# Patient Record
Sex: Female | Born: 1965 | Race: White | Hispanic: No | Smoking: Former smoker
Health system: Southern US, Community
[De-identification: ages and names within clinical notes are randomized; demographics above are authoritative.]

## PROBLEM LIST (undated history)

## (undated) DIAGNOSIS — R519 Headache, unspecified: Secondary | ICD-10-CM

## (undated) DIAGNOSIS — R51 Headache: Secondary | ICD-10-CM

## (undated) DIAGNOSIS — I82409 Acute embolism and thrombosis of unspecified deep veins of unspecified lower extremity: Secondary | ICD-10-CM

## (undated) DIAGNOSIS — E079 Disorder of thyroid, unspecified: Secondary | ICD-10-CM

## (undated) DIAGNOSIS — E119 Type 2 diabetes mellitus without complications: Secondary | ICD-10-CM

## (undated) DIAGNOSIS — R7303 Prediabetes: Secondary | ICD-10-CM

## (undated) DIAGNOSIS — F209 Schizophrenia, unspecified: Secondary | ICD-10-CM

## (undated) DIAGNOSIS — M797 Fibromyalgia: Secondary | ICD-10-CM

## (undated) HISTORY — PX: SALPINGECTOMY: SHX328

## (undated) HISTORY — PX: BREAST CYST ASPIRATION: SHX578

---

## 2009-04-21 ENCOUNTER — Ambulatory Visit: Payer: Self-pay | Admitting: Family Medicine

## 2010-09-26 ENCOUNTER — Ambulatory Visit: Payer: Self-pay | Admitting: Family Medicine

## 2011-04-09 ENCOUNTER — Emergency Department: Payer: Self-pay | Admitting: Emergency Medicine

## 2011-06-18 ENCOUNTER — Emergency Department: Payer: Self-pay | Admitting: Emergency Medicine

## 2012-01-23 ENCOUNTER — Ambulatory Visit: Payer: Self-pay | Admitting: Family Medicine

## 2012-01-30 ENCOUNTER — Ambulatory Visit: Payer: Self-pay | Admitting: Family Medicine

## 2012-03-19 ENCOUNTER — Ambulatory Visit: Payer: Self-pay | Admitting: Internal Medicine

## 2012-08-21 DIAGNOSIS — I82409 Acute embolism and thrombosis of unspecified deep veins of unspecified lower extremity: Secondary | ICD-10-CM | POA: Insufficient documentation

## 2013-01-14 ENCOUNTER — Ambulatory Visit: Payer: Self-pay

## 2013-04-01 DIAGNOSIS — R351 Nocturia: Secondary | ICD-10-CM | POA: Insufficient documentation

## 2013-04-01 DIAGNOSIS — R350 Frequency of micturition: Secondary | ICD-10-CM | POA: Insufficient documentation

## 2013-04-29 ENCOUNTER — Ambulatory Visit: Payer: Self-pay | Admitting: Family Medicine

## 2013-04-30 ENCOUNTER — Ambulatory Visit: Payer: Self-pay | Admitting: Family Medicine

## 2013-05-24 ENCOUNTER — Ambulatory Visit: Payer: Self-pay | Admitting: Internal Medicine

## 2014-06-04 ENCOUNTER — Ambulatory Visit: Payer: Self-pay | Admitting: Family Medicine

## 2014-10-18 ENCOUNTER — Ambulatory Visit: Payer: Self-pay | Admitting: Physician Assistant

## 2014-10-18 LAB — URINALYSIS, COMPLETE
BILIRUBIN, UR: NEGATIVE
BLOOD: NEGATIVE
Bacteria: NEGATIVE
GLUCOSE, UR: NEGATIVE
Ketone: NEGATIVE
Leukocyte Esterase: NEGATIVE
Nitrite: NEGATIVE
Ph: 7 (ref 5.0–8.0)
Protein: NEGATIVE
Specific Gravity: 1.01 (ref 1.000–1.030)

## 2015-02-24 ENCOUNTER — Emergency Department: Payer: Medicaid Other

## 2015-02-24 ENCOUNTER — Emergency Department
Admission: EM | Admit: 2015-02-24 | Discharge: 2015-02-24 | Discharge status: Against Medical Advice | Payer: Medicaid Other | Attending: Emergency Medicine | Admitting: Emergency Medicine

## 2015-02-24 ENCOUNTER — Encounter: Payer: Self-pay | Admitting: *Deleted

## 2015-02-24 ENCOUNTER — Other Ambulatory Visit: Payer: Self-pay

## 2015-02-24 DIAGNOSIS — R079 Chest pain, unspecified: Secondary | ICD-10-CM | POA: Insufficient documentation

## 2015-02-24 HISTORY — DX: Fibromyalgia: M79.7

## 2015-02-24 HISTORY — DX: Disorder of thyroid, unspecified: E07.9

## 2015-02-24 HISTORY — DX: Schizophrenia, unspecified: F20.9

## 2015-02-24 LAB — CBC
HCT: 40.1 % (ref 35.0–47.0)
Hemoglobin: 13.6 g/dL (ref 12.0–16.0)
MCH: 32.1 pg (ref 26.0–34.0)
MCHC: 34 g/dL (ref 32.0–36.0)
MCV: 94.4 fL (ref 80.0–100.0)
PLATELETS: 223 10*3/uL (ref 150–440)
RBC: 4.25 MIL/uL (ref 3.80–5.20)
RDW: 13.5 % (ref 11.5–14.5)
WBC: 10.7 10*3/uL (ref 3.6–11.0)

## 2015-02-24 NOTE — ED Notes (Addendum)
Pt reports pain with inspiration over the left chest area. Today, pt says that she feels like she cannot get a deep breath. Pt also states she has been having intermittent dizziness

## 2015-02-24 NOTE — ED Notes (Signed)
Called lab to find out results for METB and Troponin that was drawn at 1800 that wasn't resulted yet.

## 2015-02-24 NOTE — ED Notes (Signed)
Patient relations has been looking for her son have not found him. Pt wants to look in car escorted by security.

## 2015-02-25 LAB — BASIC METABOLIC PANEL
ANION GAP: 12 (ref 5–15)
BUN: 15 mg/dL (ref 6–20)
CO2: 22 mmol/L (ref 22–32)
Calcium: 9.1 mg/dL (ref 8.9–10.3)
Chloride: 106 mmol/L (ref 101–111)
Creatinine, Ser: 0.7 mg/dL (ref 0.44–1.00)
GFR calc non Af Amer: >60 mL/min (ref >60)
GLUCOSE: 93 mg/dL (ref 65–99)
Potassium: 4 mmol/L (ref 3.5–5.1)
Sodium: 140 mmol/L (ref 135–145)

## 2015-02-25 LAB — TROPONIN I: Troponin I: <0.03 ng/mL (ref <0.031)

## 2015-08-14 ENCOUNTER — Encounter: Payer: Self-pay | Admitting: Gynecology

## 2015-08-14 ENCOUNTER — Ambulatory Visit
Admission: EM | Admit: 2015-08-14 | Discharge: 2015-08-14 | Disposition: A | Discharge status: Home / Self Care | Payer: Medicaid Other | Attending: Family Medicine | Admitting: Family Medicine

## 2015-08-14 DIAGNOSIS — S0551XA Penetrating wound with foreign body of right eyeball, initial encounter: Secondary | ICD-10-CM

## 2015-08-14 HISTORY — DX: Acute embolism and thrombosis of unspecified deep veins of unspecified lower extremity: I82.409

## 2015-08-14 HISTORY — DX: Headache: R51

## 2015-08-14 HISTORY — DX: Headache, unspecified: R51.9

## 2015-08-14 HISTORY — DX: Prediabetes: R73.03

## 2015-08-14 MED ORDER — TETRACAINE HCL 0.5 % OP SOLN
2.0000 [drp] | Freq: Once | OPHTHALMIC | Status: AC
  Administered 2015-08-14: 2 [drp] via OPHTHALMIC

## 2015-08-14 MED ORDER — FLUORESCEIN SODIUM 1 MG OP STRP
1.0000 | ORAL_STRIP | Freq: Once | OPHTHALMIC | Status: AC
  Administered 2015-08-14: 1 via OPHTHALMIC

## 2015-08-14 MED ORDER — ERYTHROMYCIN 5 MG/GM OP OINT: TOPICAL_OINTMENT | OPHTHALMIC | Status: DC

## 2015-08-14 MED ORDER — HYDROCODONE-ACETAMINOPHEN 5-325 MG PO TABS: 1.0000 | ORAL_TABLET | Freq: Three times a day (TID) | ORAL | Status: DC | PRN

## 2015-08-14 NOTE — ED Notes (Signed)
Patient c/o right eye scratching at her pupil. Patient stated something in  Right eye

## 2015-08-14 NOTE — ED Provider Notes (Signed)
CSN: [id number]     Arrival date & time 08/14/15  1349 History   First MD Initiated Contact with Patient 08/14/15 1555   Nurses notes were reviewed.  Chief Complaint  Patient presents with  . Foreign Body in Eye    Individual reports having something in her right eye. She woke this morning had a sensation of something was in her right eye. Denies or unable to say what she got into her eye but she states is been irritating. (Consider location/radiation/quality/duration/timing/severity/associated sxs/prior Treatment) Patient is a 49 y.o. female presenting with foreign body in eye.  Foreign Body in Eye This is a new problem. The current episode started 6 to 12 hours ago. The problem occurs constantly. The problem has not changed since onset.Pertinent negatives include no chest pain, no abdominal pain, no headaches and no shortness of breath. Nothing relieves the symptoms. She has tried nothing for the symptoms.    Past Medical History  Diagnosis Date  . Thyroid disease   . Schizophrenia (HCC)   . Fibromyalgia   . Prediabetes   . Head ache   . DVT (deep venous thrombosis) (HCC)    History reviewed. No pertinent past surgical history. History reviewed. No pertinent family history. Social History  Substance Use Topics  . Smoking status: Former Games developer  . Smokeless tobacco: None  . Alcohol Use: Yes   OB History    No data available     Review of Systems  Eyes: Positive for pain, redness and itching. Negative for discharge and visual disturbance.  Respiratory: Negative for shortness of breath.   Cardiovascular: Negative for chest pain.  Gastrointestinal: Negative for abdominal pain.  Neurological: Negative for headaches.  All other systems reviewed and are negative.   Allergies  Review of patient's allergies indicates no known allergies.  Home Medications   Prior to Admission medications   Medication Sig Start Date End Date Taking? Authorizing Provider  acetaminophen  (TYLENOL) 500 MG tablet Take 500 mg by mouth every 6 (six) hours as needed.   Yes Historical Provider, MD  diclofenac (VOLTAREN) 75 MG EC tablet Take 75 mg by mouth 2 (two) times daily.   Yes Historical Provider, MD  gabapentin (NEURONTIN) 100 MG capsule Take 100 mg by mouth 3 (three) times daily.   Yes Historical Provider, MD  levothyroxine (SYNTHROID, LEVOTHROID) 137 MCG tablet Take 137 mcg by mouth daily before breakfast.   Yes Historical Provider, MD  mupirocin ointment (BACTROBAN) 2 % Place 1 application into the nose 2 (two) times daily.   Yes Historical Provider, MD  nystatin (MYCOSTATIN) 100000 UNIT/ML suspension Take 5 mLs by mouth 4 (four) times daily.   Yes Historical Provider, MD  OLANZapine zydis (ZYPREXA) 10 MG disintegrating tablet Take 10 mg by mouth at bedtime.   Yes Historical Provider, MD  erythromycin ophthalmic ointment Place a 1/2 inch ribbon of ointment into the lower eyelid. 08/14/15   Hassan Rowan, MD  HYDROcodone-acetaminophen (NORCO) 5-325 MG tablet Take 1 tablet by mouth every 8 (eight) hours as needed for moderate pain. 08/14/15   Hassan Rowan, MD   Meds Ordered and Administered this Visit   Medications  fluorescein ophthalmic strip 1 strip (1 strip Right Eye Given 08/14/15 1623)  tetracaine (PONTOCAINE) 0.5 % ophthalmic solution 2 drop (2 drops Right Eye Given 08/14/15 1623)    BP 124/79 mmHg  Pulse 70  Temp(Src) 97.6 F (36.4 C) (Oral)  Resp 18  Ht  (1.727 m)  Wt 248 lb (112.492 kg)  BMI 37.72 kg/m2  SpO2 100% No data found.   Physical Exam  Constitutional: She is oriented to person, place, and time. She appears well-developed and well-nourished. She appears distressed.  HENT:  Head: Normocephalic and atraumatic.  Eyes: Conjunctivae are normal. Pupils are equal, round, and reactive to light. Foreign body present in the right eye.  Appears be a very fine hair was found on the cornea of the right eye and his area of irritation consistent with foreign  objects causing abrasion.  Neck: Normal range of motion. Neck supple.  Musculoskeletal: Normal range of motion.  Neurological: She is alert and oriented to person, place, and time.  Skin: Skin is warm and dry. She is not diaphoretic. No erythema.  Psychiatric: She has a normal mood and affect. Her behavior is normal.    ED Course  .Foreign Body Removal Date/Time: 08/14/2015 4:25 PM Performed by: Hassan RowanWADE, Hanz Winterhalter Authorized by: Hassan RowanWADE, Kyarra Vancamp Consent: Verbal consent obtained. Consent given by: patient Body area: eye Location details: right eyelid Local anesthetic: tetracaine drops Patient sedated: no Patient restrained: no Localization method: eyelid eversion Removal mechanism: irrigation and moist cotton swab Eye examined with fluorescein. Corneal abrasion size: small Corneal abrasion location: lateral No residual rust ring present. Dressing: antibiotic ointment Depth: superficial Complexity: simple Post-procedure assessment: foreign body removed Comments: Small string-like material was removed from the eye. The eyelid was then inverted and no foreign objects were recovered found on the eyelid itself. Patient had trouble tolerating the inversion of the eye but otherwise seemed comfortable. Irrigate the eye with at least 250 mL's of saline and then placed on antibiotic eyedrops and pain medication as needed.   (including critical care time)  Labs Review Labs Reviewed - No data to display  Imaging Review No results found.   Visual Acuity Review  Right Eye Distance:   Left Eye Distance:   Bilateral Distance:    Right Eye Near: R Near: 20/50 Left Eye Near:  L Near: 20/25 Bilateral Near:  20/25 (with corrective lens)      MDM   1. Foreign body in eyeball, right, initial encounter    Patient was sent home on erythromycin ointment and Norco for pain medication and management #12. Patient was instructed that if she is not better in 2448 hrs. she will need to go to  emergency room and see ophthalmologist. Strongly discouraged her return here since this nothing further we can do here.   Hassan RowanEugene Srijan Givan, MD 08/14/15 [phone]

## 2015-08-14 NOTE — Discharge Instructions (Signed)
Eye Foreign Body  A foreign body is an object on or in the eye that should not be there. The object could be a speck of dirt or dust, a hair, an eyelash, a splinter, or any other object.  HOME CARE   Take medicines only as told by your doctor. Use eye drops or ointment as told.   If no eye patch was put on:   Keep the eye closed as much as possible.   Do not rub the eye.   Wear dark glasses in bright light.   Do not wear contact lenses until the eye feels normal, or as told by your doctor.   Wear protective eye covering when needed, especially when using high-speed tools.   If your eye is patched:   Follow your doctor's instructions for when to remove the patch.   Do notdrive or use machines while the eye patch is on. Judging distances is hard to do while wearing a patch.   Keep all follow-up visits as told by your doctor. This is important.  GET HELP IF:    Your pain gets worse.   Your vision gets worse.   You have problems with your eye patch.   You have fluid (discharge) coming from your eye.   You have redness and swelling around your eye.  MAKE SURE YOU:    Understand these instructions.   Will watch your condition.   Will get help right away if you are not doing well or get worse.     This information is not intended to replace advice given to you by your health care provider. Make sure you discuss any questions you have with your health care provider.     Document Released: 03/22/2010 Document Revised: 10/23/2014 Document Reviewed: 02/27/2013  Elsevier Interactive Patient Education 2016 [employer]

## 2015-08-25 DIAGNOSIS — E119 Type 2 diabetes mellitus without complications: Secondary | ICD-10-CM | POA: Insufficient documentation

## 2015-08-25 DIAGNOSIS — E669 Obesity, unspecified: Secondary | ICD-10-CM | POA: Insufficient documentation

## 2015-08-25 DIAGNOSIS — R519 Headache, unspecified: Secondary | ICD-10-CM | POA: Insufficient documentation

## 2015-09-12 ENCOUNTER — Encounter: Payer: Self-pay | Admitting: Gynecology

## 2015-09-12 ENCOUNTER — Ambulatory Visit
Admission: EM | Admit: 2015-09-12 | Discharge: 2015-09-12 | Disposition: A | Discharge status: Home / Self Care | Payer: Medicaid Other | Attending: Family Medicine | Admitting: Family Medicine

## 2015-09-12 DIAGNOSIS — M62830 Muscle spasm of back: Secondary | ICD-10-CM

## 2015-09-12 DIAGNOSIS — F209 Schizophrenia, unspecified: Secondary | ICD-10-CM | POA: Diagnosis not present

## 2015-09-12 DIAGNOSIS — E079 Disorder of thyroid, unspecified: Secondary | ICD-10-CM | POA: Diagnosis not present

## 2015-09-12 DIAGNOSIS — Z7982 Long term (current) use of aspirin: Secondary | ICD-10-CM | POA: Insufficient documentation

## 2015-09-12 DIAGNOSIS — M797 Fibromyalgia: Secondary | ICD-10-CM | POA: Diagnosis not present

## 2015-09-12 DIAGNOSIS — R1031 Right lower quadrant pain: Secondary | ICD-10-CM | POA: Diagnosis not present

## 2015-09-12 DIAGNOSIS — Z79899 Other long term (current) drug therapy: Secondary | ICD-10-CM | POA: Diagnosis not present

## 2015-09-12 DIAGNOSIS — M549 Dorsalgia, unspecified: Secondary | ICD-10-CM | POA: Diagnosis present

## 2015-09-12 DIAGNOSIS — Z87891 Personal history of nicotine dependence: Secondary | ICD-10-CM | POA: Diagnosis not present

## 2015-09-12 LAB — URINALYSIS COMPLETE WITH MICROSCOPIC (ARMC ONLY)
Bilirubin Urine: NEGATIVE
Glucose, UA: NEGATIVE mg/dL
HGB URINE DIPSTICK: NEGATIVE
KETONES UR: NEGATIVE mg/dL
Nitrite: NEGATIVE
PH: 7 (ref 5.0–8.0)
PROTEIN: NEGATIVE mg/dL
Specific Gravity, Urine: 1.015 (ref 1.005–1.030)

## 2015-09-12 LAB — PREGNANCY, URINE: Preg Test, Ur: NEGATIVE

## 2015-09-12 MED ORDER — TIZANIDINE HCL 4 MG PO CAPS: 4.0000 mg | ORAL_CAPSULE | Freq: Three times a day (TID) | ORAL | Status: DC | PRN

## 2015-09-12 NOTE — ED Provider Notes (Signed)
CSN: [id number]     Arrival date & time 09/12/15  1355 History   First MD Initiated Contact with Patient 09/12/15 1654     Chief Complaint  Patient presents with  . Back Pain   (Consider location/radiation/quality/duration/timing/severity/associated sxs/prior Treatment) HPI  49 yo F presents with her son concerned about back pain. Denies trauma, no fall, no accident. Has been busy cleaning house  Doing holiday cooking. Also remembers that she put up the Christmas tree yesterday. She has had no nausea, vomiting or diarrhea. No dysuria, no frequency Reports that discomfort was "very bad " last night. She is taking Extra strength Tylenol every 4 hours.  States she used to be on Diclofenac But her PCP "took it away so it wouldn't hurt my liver".  States she can't take tylenol or ASA. Her concerns and explanations wander throughout her body; and divert to multiple focuses during course of visit.  Later in the visit as back exam/consult underway patient presented additional concern  She is now complaining of right lower quadrant pain, waxing and waning "for a while" but becoming more severe.  She had a right ectopic many years ago.Right tube reported surgically absent. 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  She reports that she has been told she has increased risk of infections. She has had infections in her bowels. She ate 3 times today, denies nausea or vomiting.Denies fever, but refers back to right lower quadrant pain complaint. States that she was up during the night with pain in this area as well . Had normal bowel movement this morning without pain.No diarrhea No menses x 2 years.   Past Medical History  Diagnosis Date  . Thyroid disease   . Schizophrenia (HCC)   . Fibromyalgia   . Prediabetes   . Head ache   . DVT (deep venous thrombosis) (HCC)    History reviewed. No pertinent past surgical history. No family history on file. Social History   Substance Use Topics  . Smoking status: Former Games developer  . Smokeless tobacco: None  . Alcohol Use: Yes   OB History    No data available     Review of Systems Constitutional: No fever.  Eyes: No visual changes. ENT:No sore throat. Cardiovascular:Negative for chest pain/palpitations Respiratory: Negative for shortness of breath Gastrointestinal: Right lower quadrant abdominal pain. No nausea,vomiting, diarrhea Genitourinary: Negative for dysuria. Normal urination. Musculoskeletal: Generalized  back pain. FROM extremities without pain Skin: Negative for rash Neurological: Negative for headache, focal weakness or numbness  Allergies  Review of patient's allergies indicates no known allergies.  Home Medications   Prior to Admission medications   Medication Sig Start Date End Date Taking? Authorizing Provider  acetaminophen (TYLENOL) 500 MG tablet Take 500 mg by mouth every 6 (six) hours as needed.   Yes Historical Provider, MD  aspirin EC 81 MG tablet Take 81 mg by mouth daily.   Yes Historical Provider, MD  Atorvastatin Calcium (LIPITOR PO) Take by mouth.   Yes Historical Provider, MD  gabapentin (NEURONTIN) 100 MG capsule Take 100 mg by mouth 3 (three) times daily.   Yes Historical Provider, MD  levothyroxine (SYNTHROID, LEVOTHROID) 137 MCG tablet Take 137 mcg by mouth daily before breakfast.   Yes Historical Provider, MD  metFORMIN (GLUCOPHAGE) 500 MG tablet Take by mouth 2 (two) times daily with a meal.   Yes Historical Provider, MD  mupirocin ointment (BACTROBAN) 2 % Place 1 application into the nose 2 (two) times  daily.   Yes Historical Provider, MD  nystatin (MYCOSTATIN) 100000 UNIT/ML suspension Take 5 mLs by mouth 4 (four) times daily.   Yes Historical Provider, MD  OLANZapine zydis (ZYPREXA) 10 MG disintegrating tablet Take 10 mg by mouth at bedtime.   Yes Historical Provider, MD  diclofenac (VOLTAREN) 75 MG EC tablet Take 75 mg by mouth 2 (two) times daily.    Historical  Provider, MD  erythromycin ophthalmic ointment Place a 1/2 inch ribbon of ointment into the lower eyelid. 08/14/15   Hassan RowanEugene Wade, MD  HYDROcodone-acetaminophen (NORCO) 5-325 MG tablet Take 1 tablet by mouth every 8 (eight) hours as needed for moderate pain. 08/14/15   Hassan RowanEugene Wade, MD  tiZANidine (ZANAFLEX) 4 MG capsule Take 1 capsule (4 mg total) by mouth 3 (three) times daily as needed for muscle spasms. 09/12/15   Rae HalstedLaurie W Lee, PA-C   Meds Ordered and Administered this Visit  Medications - No data to display  BP 120/73 mmHg  Pulse 70  Temp(Src) 96.9 F (36.1 C) (Tympanic)  Resp 18  Ht 5\' 8"  (1.727 m)  Wt 247 lb (112.038 kg)  BMI 37.56 kg/m2  SpO2 98% No data found.   Physical Exam   Constitutional -alert and oriented,well appearing  General: No  acute distress Head-atraumatic, normocephalic Eyes- conjunctiva normal, EOMI ,conjugate gaze Ears: grossly normal hearing Nose- no congestion or rhinorrhea Mouth/throat- mucous membranes moist ,oropharynx non-erythematous Neck- supple without glandular enlargement CV- regular rate and rhythmn Resp-no distress, normal respiratory effort,clear to auscultation bilaterally Back - para spinal muscle tenderness to palpation, right greater than left ; can localize muscle belly tenderness, no CVAT , area of discomfort inconsistent during exam; SLR negative and DTRs and pulses are present and equal bilaterally GI- obese, generally soft, and initial exam was negative in all quadrants , + BS all quadrants . Patient later indicates that the right lower quadrant is painful And returns to indicate that area. My palpation yields no mass or fullness and pain report very inconsistent  GU-  not examined-defered  MSK- non tender, normal ROM, ambulatory, no gait instability,on /off table solo, she drove here, ambulatory in area and self care Neuro- focus wanders and repeats,alert and generally conversational, no gross focal neurological deficit  appreciated,  Skin-warm,dry ,intact;  Psych-mood and affect grossly normal; speech and behavior grossly normal  ED Course  Procedures (including critical care time)  Labs Review Labs Reviewed  URINALYSIS COMPLETEWITH MICROSCOPIC ([facility] ONLY) - Abnormal; Notable for the following:    Color, Urine STRAW (*)    Leukocytes, UA TRACE (*)    Bacteria, UA RARE (*)    Squamous Epithelial / LPF 0-5 (*)    All other components within normal limits  URINE CULTURE  PREGNANCY, URINE    Imaging Review No results found.  Consulted with Dr Thurmond ButtsWade. We do not have CT available at his time and he recommends we defer additional labs and intervention- To  refer patient to ER for evaluation of her abdominal pain. Pelvic exam deferred pending additional studies. She is counseled to call in 3 days for final on her urinalysis - no medication given at this time; Her back muscle spasms were treated with a bedtime does of Zanaflex to help her sleep. Her son is of the home and present throughourt the visit, exam and pain and both express understanding  MDM   1. Muscle spasm of back   2. RLQ abdominal pain    Plan: Test results and diagnosis reviewed with  patient/son We recommend that her right lower quadrant pain be further evaluated and they are counseled to report to ER directly Her urine is being submitted for culture, u/a finding non-specific Back pain felt related to over activity and muscle spasm Medication given, heat pad and gentle stretches encouraged Rx as per orders;  benefits, risks, potential side effects reviewed     Rae Halsted, PA-C 09/12/15 1947

## 2015-09-12 NOTE — ED Notes (Signed)
Patient c/o lower back pain.

## 2015-09-12 NOTE — Discharge Instructions (Signed)
Even though you came to see Andrea [name] because of your back pain, you have also talked about Having pain in the right lower side of your abdomen /belly. You have had surgery on that area in the past, but you still have your appendix by your report. You have both your ovaries and your uterus by your report to me. We are unable at this time to evaluate inside your abdomen. I cannot see inside you to tell  if this might be a problem with your ovary, your appendix,a possible stone from the kidney--or as you mentioned a problem with the intestines as you have had before. As I discussed with you and your son, I need to recommend that you travel to the Emergency Room so that the right lower pain can be evaluated.   Your back exam shows muscle spasm---tight crampy muscles-- This is common when you have been doing a lot of heavy cleaning and moving things- As well as all the holiday -preparations you told me about. A heat pad or ice packs can help make you comfortable as well I have given you a prescription for muscle relaxer to be used at bedtime in the future   Please be sure to go to have your abdominal pain examined tonight !   Abdominal Pain, Adult Many things can cause abdominal pain. Usually, abdominal pain is not caused by a disease and will improve without treatment. It can often be observed and treated at home. Your health care provider will do a physical exam and possibly order blood tests and X-rays to help determine the seriousness of your pain. However, in many cases, more time must pass before a clear cause of the pain can be found. Before that point, your health care provider may not know if you need more testing or further treatment. HOME CARE INSTRUCTIONS Monitor your abdominal pain for any changes. The following actions may help to alleviate any discomfort you are experiencing:  Only take over-the-counter or prescription medicines as directed by your health care provider.  Do not take  laxatives unless directed to do so by your health care provider.  Try a clear liquid diet (broth, tea, or water) as directed by your health care provider. Slowly move to a bland diet as tolerated. SEEK MEDICAL CARE IF:  You have unexplained abdominal pain.  You have abdominal pain associated with nausea or diarrhea.  You have pain when you urinate or have a bowel movement.  You experience abdominal pain that wakes you in the night.  You have abdominal pain that is worsened or improved by eating food.  You have abdominal pain that is worsened with eating fatty foods.  You have a fever. SEEK IMMEDIATE MEDICAL CARE IF:  Your pain does not go away within 2 hours.  You keep throwing up (vomiting).  Your pain is felt only in portions of the abdomen, such as the right side or the left lower portion of the abdomen.  You pass bloody or black tarry stools. MAKE SURE YOU:  Understand these instructions.  Will watch your condition.  Will get help right away if you are not doing well or get worse.   This information is not intended to replace advice given to you by your health care provider. Make sure you discuss any questions you have with your health care provider.   Document Released: 07/12/2005 Document Revised: 06/23/2015 Document Reviewed: 06/11/2013 Elsevier Interactive Patient Education 2016 [place].  Back Exercises If you have pain in your  back, do these exercises 2-3 times each day or as told by your doctor. When the pain goes away, do the exercises once each day, but repeat the steps more times for each exercise (do more repetitions). If you do not have pain in your back, do these exercises once each day or as told by your doctor. EXERCISES Single Knee to Chest Do these steps 3-5 times in a row for each leg:  Lie on your back on a firm bed or the floor with your legs stretched out.  Bring one knee to your chest.  Hold your knee to your chest by grabbing your knee  or thigh.  Pull on your knee until you feel a gentle stretch in your lower back.  Keep doing the stretch for 10-30 seconds.  Slowly let go of your leg and straighten it. Pelvic Tilt Do these steps 5-10 times in a row:  Lie on your back on a firm bed or the floor with your legs stretched out.  Bend your knees so they point up to the ceiling. Your feet should be flat on the floor.  Tighten your lower belly (abdomen) muscles to press your lower back against the floor. This will make your tailbone point up to the ceiling instead of pointing down to your feet or the floor.  Stay in this position for 5-10 seconds while you gently tighten your muscles and breathe evenly. Cat-Cow Do these steps until your lower back bends more easily:  Get on your hands and knees on a firm surface. Keep your hands under your shoulders, and keep your knees under your hips. You may put padding under your knees.  Let your head hang down, and make your tailbone point down to the floor so your lower back is round like the back of a cat.  Stay in this position for 5 seconds.  Slowly lift your head and make your tailbone point up to the ceiling so your back hangs low (sags) like the back of a cow.  Stay in this position for 5 seconds. Press-Ups Do these steps 5-10 times in a row:  Lie on your belly (face-down) on the floor.  Place your hands near your head, about shoulder-width apart.  While you keep your back relaxed and keep your hips on the floor, slowly straighten your arms to raise the top half of your body and lift your shoulders. Do not use your back muscles. To make yourself more comfortable, you may change where you place your hands.  Stay in this position for 5 seconds.  Slowly return to lying flat on the floor. [place] Do these steps 10 times in a row:  Lie on your back on a firm surface.  Bend your knees so they point up to the ceiling. Your feet should be flat on the floor.  Tighten your  butt muscles and lift your butt off of the floor until your waist is almost as high as your knees. If you do not feel the muscles working in your butt and the back of your thighs, slide your feet 1-2 inches farther away from your butt.  Stay in this position for 3-5 seconds.  Slowly lower your butt to the floor, and let your butt muscles relax. If this exercise is too easy, try doing it with your arms crossed over your chest. Belly Crunches Do these steps 5-10 times in a row:  Lie on your back on a firm bed or the floor with your legs stretched  out.  [place] your knees so they point up to the ceiling. Your feet should be flat on the floor.  Cross your arms over your chest.  Tip your chin a little bit toward your chest but do not bend your neck.  Tighten your belly muscles and slowly raise your chest just enough to lift your shoulder blades a tiny bit off of the floor.  Slowly lower your chest and your head to the floor. Back Lifts Do these steps 5-10 times in a row: 1. Lie on your belly (face-down) with your arms at your sides, and rest your forehead on the floor. 2. Tighten the muscles in your legs and your butt. 3. Slowly lift your chest off of the floor while you keep your hips on the floor. Keep the back of your head in line with the curve in your back. Look at the floor while you do this. 4. Stay in this position for 3-5 seconds. 5. Slowly lower your chest and your face to the floor. GET HELP IF:  Your back pain gets a lot worse when you do an exercise.  Your back pain does not lessen 2 hours after you exercise. If you have any of these problems, stop doing the exercises. Do not do them again unless your doctor says it is okay. GET HELP RIGHT AWAY IF:  You have sudden, very bad back pain. If this happens, stop doing the exercises. Do not do them again unless your doctor says it is okay.   This information is not intended to replace advice given to you by your health care  provider. Make sure you discuss any questions you have with your health care provider.   Document Released: 11/04/2010 Document Revised: 06/23/2015 Document Reviewed: 11/26/2014 Elsevier Interactive Patient Education 2016 [place] Injury Prevention Back injuries can be very painful. They can also be difficult to heal. After having one back injury, you are more likely to injure your back again. It is important to learn how to avoid injuring or re-injuring your back. The following tips can help you to prevent a back injury. WHAT SHOULD I KNOW ABOUT PHYSICAL FITNESS?  Exercise for 30 minutes per day on most days of the week or as told by your doctor. Make sure to:  Do aerobic exercises, such as walking, jogging, biking, or swimming.  Do exercises that increase balance and strength, such as tai chi and yoga.  Do stretching exercises. This helps with flexibility.  Try to develop strong belly (abdominal) muscles. Your belly muscles help to support your back.  Stay at a healthy weight. This helps to decrease your risk of a back injury. WHAT SHOULD I KNOW ABOUT MY DIET?  Talk with your doctor about your overall diet. Take supplements and vitamins only as told by your doctor.  Talk with your doctor about how much calcium and vitamin D you need each day. These nutrients help to prevent weakening of the bones (osteoporosis).  Include good sources of calcium in your diet, such as:  Dairy products.  Green leafy vegetables.  Products that have had calcium added to them (fortified).  Include good sources of vitamin D in your diet, such as:  Milk.  Foods that have had vitamin D added to them. WHAT SHOULD I KNOW ABOUT MY POSTURE?  Sit up straight and stand up straight. Avoid leaning forward when you sit or hunching over when you stand.  Choose chairs that have good low-back (lumbar) support.  If you  work at a desk, sit close to it so you do not need to lean over. Keep your  chin tucked in. Keep your neck drawn back. Keep your elbows bent so your arms look like the letter "L" (right angle).  Sit high and close to the steering wheel when you drive. Add a low-back support to your car seat, if needed.  Avoid sitting or standing in one position for very long. Take breaks to get up, stretch, and walk around at least one time every hour. Take breaks every hour if you are driving for long periods of time.  Sleep on your side with your knees slightly bent, or sleep on your back with a pillow under your knees. Do not lie on the front of your body to sleep. WHAT SHOULD I KNOW ABOUT LIFTING, TWISTING, AND REACHING Lifting and Heavy Lifting  Avoid heavy lifting, especially lifting over and over again. If you must do heavy lifting:  Stretch before lifting.  Work slowly.  Rest between lifts.  Use a tool such as a cart or a dolly to move objects if one is available.  Make several small trips instead of carrying one heavy load.  Ask for help when you need it, especially when moving big objects.  Follow these steps when lifting:  Stand with your feet shoulder-width apart.  Get as close to the object as you can. Do not pick up a heavy object that is far from your body.  Use handles or lifting straps if they are available.  Bend at your knees. Squat down, but keep your heels off the floor.  Keep your shoulders back. Keep your chin tucked in. Keep your back straight.  Lift the object slowly while you tighten the muscles in your legs, belly, and butt. Keep the object as close to the center of your body as possible.  Follow these steps when putting down a heavy load:  Stand with your feet shoulder-width apart.  Lower the object slowly while you tighten the muscles in your legs, belly, and butt. Keep the object as close to the center of your body as possible.  Keep your shoulders back. Keep your chin tucked in. Keep your back straight.  Bend at your knees. Squat  down, but keep your heels off the floor.  Use handles or lifting straps if they are available. Twisting and Reaching  Avoid lifting heavy objects above your waist.  Do not twist at your waist while you are lifting or carrying a load. If you need to turn, move your feet.  Do not bend over without bending at your knees.  Avoid reaching over your head, across a table, or for an object on a high surface.  WHAT ARE SOME OTHER TIPS?  Avoid wet floors and icy ground. Keep sidewalks clear of ice to prevent falls.   Do not sleep on a mattress that is too soft or too hard.   Keep items that you use often within easy reach.   Put heavier objects on shelves at waist level, and put lighter objects on lower or higher shelves.  Find ways to lower your stress, such as:  Exercise.  Massage.  Relaxation techniques.  Talk with your doctor if you feel anxious or depressed. These conditions can make back pain worse.  Wear flat heel shoes with cushioned soles.  Avoid making quick (sudden) movements.  Use both shoulder straps when carrying a backpack.  Do not use any tobacco products, including cigarettes, chewing tobacco, or  electronic cigarettes. If you need help quitting, ask your doctor.   This information is not intended to replace advice given to you by your health care provider. Make sure you discuss any questions you have with your health care provider.   Document Released: 03/20/2008 Document Revised: 02/16/2015 Document Reviewed: 10/06/2014 Elsevier Interactive Patient Education [employer].

## 2015-09-14 LAB — URINE CULTURE: SPECIAL REQUESTS: NORMAL

## 2015-11-05 ENCOUNTER — Encounter: Payer: Self-pay | Admitting: Emergency Medicine

## 2015-11-05 ENCOUNTER — Ambulatory Visit
Admission: EM | Admit: 2015-11-05 | Discharge: 2015-11-05 | Disposition: A | Discharge status: Home / Self Care | Payer: Medicaid Other | Attending: Family Medicine | Admitting: Family Medicine

## 2015-11-05 DIAGNOSIS — Z7984 Long term (current) use of oral hypoglycemic drugs: Secondary | ICD-10-CM | POA: Diagnosis not present

## 2015-11-05 DIAGNOSIS — E079 Disorder of thyroid, unspecified: Secondary | ICD-10-CM | POA: Insufficient documentation

## 2015-11-05 DIAGNOSIS — Z7982 Long term (current) use of aspirin: Secondary | ICD-10-CM | POA: Insufficient documentation

## 2015-11-05 DIAGNOSIS — F209 Schizophrenia, unspecified: Secondary | ICD-10-CM | POA: Diagnosis not present

## 2015-11-05 DIAGNOSIS — M797 Fibromyalgia: Secondary | ICD-10-CM | POA: Insufficient documentation

## 2015-11-05 DIAGNOSIS — N760 Acute vaginitis: Secondary | ICD-10-CM | POA: Diagnosis not present

## 2015-11-05 DIAGNOSIS — E119 Type 2 diabetes mellitus without complications: Secondary | ICD-10-CM | POA: Diagnosis not present

## 2015-11-05 DIAGNOSIS — T192XXA Foreign body in vulva and vagina, initial encounter: Secondary | ICD-10-CM | POA: Diagnosis present

## 2015-11-05 HISTORY — DX: Type 2 diabetes mellitus without complications: E11.9

## 2015-11-05 LAB — CHLAMYDIA/NGC RT PCR (ARMC ONLY)
Chlamydia Tr: NOT DETECTED
N gonorrhoeae: NOT DETECTED

## 2015-11-05 LAB — WET PREP, GENITAL
Clue Cells Wet Prep HPF POC: NONE SEEN
Sperm: NONE SEEN
Trich, Wet Prep: NONE SEEN
Yeast Wet Prep HPF POC: NONE SEEN

## 2015-11-05 MED ORDER — METRONIDAZOLE 500 MG PO TABS: 500.0000 mg | ORAL_TABLET | Freq: Two times a day (BID) | ORAL | Status: DC

## 2015-11-05 NOTE — Discharge Instructions (Signed)

## 2015-11-05 NOTE — ED Notes (Signed)
Patient sates that her and her boyfriend had intercourse last night and the condom is missing.  Patient states that she is not sure if the condom is still in her vaginal.  Patient reports some abdominal cramping.  Patient denies vaginal discharge or bleeding.

## 2015-11-05 NOTE — ED Provider Notes (Signed)
CSN: [id number]     Arrival date & time 11/05/15  1444 History   First MD Initiated Contact with Patient 11/05/15 1526     Chief Complaint  Patient presents with  . Foreign Body in Vagina   (Consider location/radiation/quality/duration/timing/severity/associated sxs/prior Treatment) HPI   This a 50 year old female who presents with a possible foreign body in her vagina. She states that she and her boyfriend had one episode of intercourse last night and he told her that he could not find the condom afterwards. She assumes that it's lost in her vagina. She tried on several occasions to retrieve it herself but was unsuccessful. She states that she did wash her bed linen this morning but did not specifically look for a loss condom. She has had no fever or chills and has not had had discharge does have some pain over the left lower quadrant.  Past Medical History  Diagnosis Date  . Thyroid disease   . Schizophrenia (HCC)   . Fibromyalgia   . Prediabetes   . Head ache   . DVT (deep venous thrombosis) (HCC)   . Diabetes mellitus without complication (HCC)    History reviewed. No pertinent past surgical history. History reviewed. No pertinent family history. Social History  Substance Use Topics  . Smoking status: Former Games developer  . Smokeless tobacco: None  . Alcohol Use: Yes   OB History    No data available     Review of Systems  Constitutional: Negative for fever, chills, activity change and fatigue.  Genitourinary: Positive for vaginal discharge.  All other systems reviewed and are negative.   Allergies  Review of patient's allergies indicates no known allergies.  Home Medications   Prior to Admission medications   Medication Sig Start Date End Date Taking? Authorizing Provider  acetaminophen (TYLENOL) 500 MG tablet Take 500 mg by mouth every 6 (six) hours as needed.    Historical Provider, MD  aspirin EC 81 MG tablet Take 81 mg by mouth daily.    Historical Provider, MD   Atorvastatin Calcium (LIPITOR PO) Take by mouth.    Historical Provider, MD  diclofenac (VOLTAREN) 75 MG EC tablet Take 75 mg by mouth 2 (two) times daily.    Historical Provider, MD  erythromycin ophthalmic ointment Place a 1/2 inch ribbon of ointment into the lower eyelid. 08/14/15   Hassan Rowan, MD  gabapentin (NEURONTIN) 100 MG capsule Take 100 mg by mouth 3 (three) times daily.    Historical Provider, MD  HYDROcodone-acetaminophen (NORCO) 5-325 MG tablet Take 1 tablet by mouth every 8 (eight) hours as needed for moderate pain. 08/14/15   Hassan Rowan, MD  levothyroxine (SYNTHROID, LEVOTHROID) 137 MCG tablet Take 137 mcg by mouth daily before breakfast.    Historical Provider, MD  metFORMIN (GLUCOPHAGE) 500 MG tablet Take by mouth 2 (two) times daily with a meal.    Historical Provider, MD  metroNIDAZOLE (FLAGYL) 500 MG tablet Take 1 tablet (500 mg total) by mouth 2 (two) times daily. 11/05/15   Lutricia Feil, PA-C  mupirocin ointment (BACTROBAN) 2 % Place 1 application into the nose 2 (two) times daily.    Historical Provider, MD  nystatin (MYCOSTATIN) 100000 UNIT/ML suspension Take 5 mLs by mouth 4 (four) times daily.    Historical Provider, MD  OLANZapine zydis (ZYPREXA) 10 MG disintegrating tablet Take 10 mg by mouth at bedtime.    Historical Provider, MD  tiZANidine (ZANAFLEX) 4 MG capsule Take 1 capsule (4 mg total) by mouth 3 (  three) times daily as needed for muscle spasms. 09/12/15   Rae Halsted, PA-C   Meds Ordered and Administered this Visit  Medications - No data to display  BP 130/76 mmHg  Pulse 97  Temp(Src) 97.2 F (36.2 C) (Tympanic)  Resp 16  Ht  (1.727 m)  Wt 230 lb (104.327 kg)  BMI 34.98 kg/m2  SpO2 97% No data found.   Physical Exam  Constitutional: She is oriented to person, place, and time. She appears well-developed and well-nourished. No distress.  HENT:  Head: Normocephalic and atraumatic.  Eyes: Conjunctivae are normal. Pupils are equal, round,  and reactive to light.  Neck: Normal range of motion. Neck supple.  Genitourinary: Uterus normal. Vaginal discharge found.  Exam was performed in the presence of Heather, RN who acted as chaperone and assisted external examination shows mild erythema on the external labia. No obvious discharge is evident. Speculum was inserted and observation of the vaginal walls showed a thick mucus with milky discharge. Samples were taken and sent to laboratory. A very thorough examination with speculum was carried out of the vagina and no foreign bodies were found. Several attempts were made with the speculum to view the entire vagina but again no foreign body was seen. Manual was then performed and again nothing was palpated within the vagina. There was no tenderness with cervical motion and no adnexal tenderness found.  Musculoskeletal: Normal range of motion. She exhibits no edema or tenderness.  Neurological: She is alert and oriented to person, place, and time.  Skin: Skin is warm and dry. She is not diaphoretic.  Psychiatric: She has a normal mood and affect. Her behavior is normal. Judgment and thought content normal.  Nursing note and vitals reviewed.   ED Course  Procedures (including critical care time)  Labs Review Labs Reviewed  WET PREP, GENITAL - Abnormal; Notable for the following:    WBC, Wet Prep HPF POC MANY (*)    All other components within normal limits  CHLAMYDIA/NGC RT PCR [facility] ONLY)    Imaging Review No results found.   Visual Acuity Review  Right Eye Distance:   Left Eye Distance:   Bilateral Distance:    Right Eye Near:   Left Eye Near:    Bilateral Near:         MDM   1. Vaginitis    New Prescriptions   METRONIDAZOLE (FLAGYL) 500 MG TABLET    Take 1 tablet (500 mg total) by mouth 2 (two) times daily.  Plan: 1. Test/x-ray results and diagnosis reviewed with patient 2. rx as per orders; risks, benefits, potential side effects reviewed with  patient 3. Recommend supportive treatment with day intercourse until after she completes her course of therapy. I warned her regarding alcohol intake.. I told her that my examination today failed to reveal any foreign body and it's quite possible that the condom was not used during intercourse. I told her to be sure to check her linen in her washing machine for the condom. Also asked her to observe for any increasing pain discharge as I may have missed the condom and intact K she needs to see a GYN for further evaluation. Nucleic acid amplification for GC and chlamydia was sent to the lab today I asked her to follow-up with her primary care if she has any worsening symptoms. 4. F/u prn if symptoms worsen or don't improve     Lutricia Feil, PA-C 11/05/15 1641

## 2015-11-06 ENCOUNTER — Telehealth: Payer: Self-pay

## 2015-11-06 NOTE — Telephone Encounter (Signed)
-----   Message from Lutricia Feil, PA-C sent at 11/06/2015  8:18 AM EST ----- Please notify patient that her GC Chlamydia results are negative.  Thanks, Optometrist.

## 2015-11-06 NOTE — ED Notes (Signed)
Spoke with patient. Patient advised of all results and verbalized understanding. Will call back with any future questions or concerns. MAH  

## 2015-12-08 ENCOUNTER — Ambulatory Visit: Payer: Medicaid Other

## 2015-12-08 ENCOUNTER — Encounter: Payer: Self-pay | Admitting: Emergency Medicine

## 2015-12-08 ENCOUNTER — Ambulatory Visit
Admission: EM | Admit: 2015-12-08 | Discharge: 2015-12-08 | Disposition: A | Discharge status: Home / Self Care | Payer: Medicaid Other | Attending: Family Medicine | Admitting: Family Medicine

## 2015-12-08 DIAGNOSIS — S9032XA Contusion of left foot, initial encounter: Secondary | ICD-10-CM | POA: Diagnosis not present

## 2015-12-08 DIAGNOSIS — Z7982 Long term (current) use of aspirin: Secondary | ICD-10-CM | POA: Insufficient documentation

## 2015-12-08 DIAGNOSIS — F209 Schizophrenia, unspecified: Secondary | ICD-10-CM | POA: Diagnosis not present

## 2015-12-08 DIAGNOSIS — M797 Fibromyalgia: Secondary | ICD-10-CM | POA: Diagnosis not present

## 2015-12-08 DIAGNOSIS — Z87891 Personal history of nicotine dependence: Secondary | ICD-10-CM | POA: Diagnosis not present

## 2015-12-08 DIAGNOSIS — Z7984 Long term (current) use of oral hypoglycemic drugs: Secondary | ICD-10-CM | POA: Insufficient documentation

## 2015-12-08 DIAGNOSIS — S60419A Abrasion of unspecified finger, initial encounter: Secondary | ICD-10-CM

## 2015-12-08 DIAGNOSIS — W458XXA Other foreign body or object entering through skin, initial encounter: Secondary | ICD-10-CM | POA: Diagnosis not present

## 2015-12-08 DIAGNOSIS — E079 Disorder of thyroid, unspecified: Secondary | ICD-10-CM | POA: Insufficient documentation

## 2015-12-08 DIAGNOSIS — E119 Type 2 diabetes mellitus without complications: Secondary | ICD-10-CM | POA: Diagnosis not present

## 2015-12-08 DIAGNOSIS — M79672 Pain in left foot: Secondary | ICD-10-CM | POA: Diagnosis present

## 2015-12-08 DIAGNOSIS — Z79899 Other long term (current) drug therapy: Secondary | ICD-10-CM | POA: Diagnosis not present

## 2015-12-08 DIAGNOSIS — S60413A Abrasion of left middle finger, initial encounter: Secondary | ICD-10-CM | POA: Diagnosis not present

## 2015-12-08 NOTE — ED Notes (Signed)
Patient states that on Saturday she hit the top of her left foot on a cherry picker.  Patient c/o left foot pain and swelling.  Patient c/o laceration to her left 3rd finger.  Patient states she cut her finger on a tree today while walking her dog.

## 2015-12-08 NOTE — ED Provider Notes (Signed)
CSN: [id number]     Arrival date & time 12/08/15  1247 History   None    Chief Complaint  Patient presents with  . Foot Pain   (Consider location/radiation/quality/duration/timing/severity/associated sxs/prior Treatment) HPI Comments: 50 yo female with a c/o 5 day h/o left foot pain, bruising and swelling since hitting the top of her foot on a cherry picker. Denies numbness/tingling. Has been able to bear weight. Also complains of a cut to her left 3rd finger with a tree while she was walking her dog this morning.   The history is provided by the patient.    Past Medical History  Diagnosis Date  . Thyroid disease   . Schizophrenia (HCC)   . Fibromyalgia   . Prediabetes   . Head ache   . DVT (deep venous thrombosis) (HCC)   . Diabetes mellitus without complication (HCC)    History reviewed. No pertinent past surgical history. History reviewed. No pertinent family history. Social History  Substance Use Topics  . Smoking status: Former Games developer  . Smokeless tobacco: None  . Alcohol Use: Yes   OB History    No data available     Review of Systems  Allergies  Review of patient's allergies indicates no known allergies.  Home Medications   Prior to Admission medications   Medication Sig Start Date End Date Taking? Authorizing Provider  acetaminophen (TYLENOL) 500 MG tablet Take 500 mg by mouth every 6 (six) hours as needed.    Historical Provider, MD  aspirin EC 81 MG tablet Take 81 mg by mouth daily.    Historical Provider, MD  Atorvastatin Calcium (LIPITOR PO) Take by mouth.    Historical Provider, MD  diclofenac (VOLTAREN) 75 MG EC tablet Take 75 mg by mouth 2 (two) times daily.    Historical Provider, MD  erythromycin ophthalmic ointment Place a 1/2 inch ribbon of ointment into the lower eyelid. 08/14/15   Hassan Rowan, MD  gabapentin (NEURONTIN) 100 MG capsule Take 100 mg by mouth 3 (three) times daily.    Historical Provider, MD  HYDROcodone-acetaminophen (NORCO) 5-325  MG tablet Take 1 tablet by mouth every 8 (eight) hours as needed for moderate pain. 08/14/15   Hassan Rowan, MD  levothyroxine (SYNTHROID, LEVOTHROID) 137 MCG tablet Take 137 mcg by mouth daily before breakfast.    Historical Provider, MD  metFORMIN (GLUCOPHAGE) 500 MG tablet Take by mouth 2 (two) times daily with a meal.    Historical Provider, MD  metroNIDAZOLE (FLAGYL) 500 MG tablet Take 1 tablet (500 mg total) by mouth 2 (two) times daily. 11/05/15   Lutricia Feil, PA-C  mupirocin ointment (BACTROBAN) 2 % Place 1 application into the nose 2 (two) times daily.    Historical Provider, MD  nystatin (MYCOSTATIN) 100000 UNIT/ML suspension Take 5 mLs by mouth 4 (four) times daily.    Historical Provider, MD  OLANZapine zydis (ZYPREXA) 10 MG disintegrating tablet Take 10 mg by mouth at bedtime.    Historical Provider, MD  tiZANidine (ZANAFLEX) 4 MG capsule Take 1 capsule (4 mg total) by mouth 3 (three) times daily as needed for muscle spasms. 09/12/15   Rae Halsted, PA-C   Meds Ordered and Administered this Visit  Medications - No data to display  BP 110/73 mmHg  Pulse 75  Temp(Src) 97 F (36.1 C) (Tympanic)  Resp 16  Ht  (1.727 m)  Wt 246 lb (111.585 kg)  BMI 37.41 kg/m2  SpO2 98%  LMP  No data found.  Physical Exam  Constitutional: She appears well-developed and well-nourished. No distress.  Musculoskeletal: She exhibits no edema.       Left hand: She exhibits laceration (superficial abrasion over left 3rd finger; not actively bleeding). She exhibits normal range of motion, no tenderness, no bony tenderness, normal two-point discrimination, normal capillary refill, no deformity and no swelling. Normal sensation noted. Normal strength noted.       Left foot: There is tenderness, bony tenderness and swelling. There is normal range of motion, normal capillary refill, no crepitus, no deformity and no laceration.  Foot neurovascularly intact  Skin: She is not diaphoretic.  Nursing  note and vitals reviewed.   ED Course  Procedures (including critical care time)  Labs Review Labs Reviewed - No data to display  Imaging Review Dg Foot Complete Left  12/08/2015  CLINICAL DATA:  Left foot pain, bruising and swelling after hitting the foot on a cherry picker 4 days ago. EXAM: LEFT FOOT - COMPLETE 3+ VIEW COMPARISON:  Left toes dated 03/19/2012. FINDINGS: Moderate calcaneal spur formation. Mild dorsal soft tissue swelling in the proximal and mid foot. No fracture or dislocation seen. IMPRESSION: No fracture. Electronically Signed   By: Beckie Salts M.D.   On: 12/08/2015 14:40     Visual Acuity Review  Right Eye Distance:   Left Eye Distance:   Bilateral Distance:    Right Eye Near:   Left Eye Near:    Bilateral Near:         MDM   1. Contusion of left foot, initial encounter   2. Abrasion of finger, initial encounter     1. x-ray results and diagnosis reviewed with patient 2. rx as per orders above; reviewed possible side effects, interactions, risks and benefits  3. Recommend supportive treatment with otc analgesics prn, ice, rest  4. Follow-up prn if symptoms worsen or don't improve  Payton Mccallum, MD 12/08/15 [phone]

## 2015-12-28 ENCOUNTER — Emergency Department
Admission: EM | Admit: 2015-12-28 | Discharge: 2015-12-28 | Disposition: A | Discharge status: Home / Self Care | Payer: Medicaid Other | Attending: Emergency Medicine | Admitting: Emergency Medicine

## 2015-12-28 DIAGNOSIS — E119 Type 2 diabetes mellitus without complications: Secondary | ICD-10-CM | POA: Diagnosis not present

## 2015-12-28 DIAGNOSIS — Z79899 Other long term (current) drug therapy: Secondary | ICD-10-CM | POA: Diagnosis not present

## 2015-12-28 DIAGNOSIS — Z792 Long term (current) use of antibiotics: Secondary | ICD-10-CM | POA: Insufficient documentation

## 2015-12-28 DIAGNOSIS — Z87891 Personal history of nicotine dependence: Secondary | ICD-10-CM | POA: Diagnosis not present

## 2015-12-28 DIAGNOSIS — R1084 Generalized abdominal pain: Secondary | ICD-10-CM

## 2015-12-28 DIAGNOSIS — F419 Anxiety disorder, unspecified: Secondary | ICD-10-CM | POA: Insufficient documentation

## 2015-12-28 DIAGNOSIS — Z791 Long term (current) use of non-steroidal anti-inflammatories (NSAID): Secondary | ICD-10-CM | POA: Insufficient documentation

## 2015-12-28 DIAGNOSIS — K5901 Slow transit constipation: Secondary | ICD-10-CM | POA: Diagnosis not present

## 2015-12-28 DIAGNOSIS — Z7982 Long term (current) use of aspirin: Secondary | ICD-10-CM | POA: Diagnosis not present

## 2015-12-28 DIAGNOSIS — Z7984 Long term (current) use of oral hypoglycemic drugs: Secondary | ICD-10-CM | POA: Insufficient documentation

## 2015-12-28 DIAGNOSIS — R109 Unspecified abdominal pain: Secondary | ICD-10-CM | POA: Diagnosis present

## 2015-12-28 LAB — CBC
HCT: 41 % (ref 35.0–47.0)
Hemoglobin: 13.8 g/dL (ref 12.0–16.0)
MCH: 30.5 pg (ref 26.0–34.0)
MCHC: 33.7 g/dL (ref 32.0–36.0)
MCV: 90.6 fL (ref 80.0–100.0)
PLATELETS: 250 10*3/uL (ref 150–440)
RBC: 4.53 MIL/uL (ref 3.80–5.20)
RDW: 13 % (ref 11.5–14.5)
WBC: 12.4 10*3/uL — AB (ref 3.6–11.0)

## 2015-12-28 LAB — COMPREHENSIVE METABOLIC PANEL
ALK PHOS: 83 U/L (ref 38–126)
ALT: 28 U/L (ref 14–54)
AST: 27 U/L (ref 15–41)
Albumin: 4.5 g/dL (ref 3.5–5.0)
Anion gap: 10 (ref 5–15)
BILIRUBIN TOTAL: 0.8 mg/dL (ref 0.3–1.2)
BUN: 14 mg/dL (ref 6–20)
CALCIUM: 9.2 mg/dL (ref 8.9–10.3)
CO2: 22 mmol/L (ref 22–32)
CREATININE: 0.57 mg/dL (ref 0.44–1.00)
Chloride: 104 mmol/L (ref 101–111)
Glucose, Bld: 108 mg/dL — ABNORMAL HIGH (ref 65–99)
Potassium: 3.9 mmol/L (ref 3.5–5.1)
Sodium: 136 mmol/L (ref 135–145)
TOTAL PROTEIN: 8.2 g/dL — AB (ref 6.5–8.1)

## 2015-12-28 LAB — URINALYSIS COMPLETE WITH MICROSCOPIC (ARMC ONLY)
BILIRUBIN URINE: NEGATIVE
Bacteria, UA: NONE SEEN
Glucose, UA: NEGATIVE mg/dL
KETONES UR: NEGATIVE mg/dL
NITRITE: NEGATIVE
PH: 6 (ref 5.0–8.0)
Protein, ur: NEGATIVE mg/dL
Specific Gravity, Urine: 1.003 — ABNORMAL LOW (ref 1.005–1.030)

## 2015-12-28 LAB — LIPASE, BLOOD: Lipase: 48 U/L (ref 11–51)

## 2015-12-28 NOTE — Discharge Instructions (Signed)

## 2015-12-28 NOTE — ED Notes (Signed)
States she is having abd pain . States pain is on the right side.. States she thinks that she may have a condom stuck.. Also having some issues having a bowel movement

## 2015-12-28 NOTE — ED Provider Notes (Signed)
[facility] Emergency Department Provider Note  ____________________________________________    I have reviewed the triage vital signs and the nursing notes.   HISTORY  Chief Complaint Abdominal Pain    HPI Andrea [name] is a 50 y.o. female who presents with numerous complaints, primarily she is concerned about possible retained vaginal foreign body. She reports on January 21 she had intercourse and "the condom was lost". She is concerned that she still has a condom in her vagina. She denies fevers or chills. She denies vaginal discharge. No nausea or vomiting. She does complain of intermittent abdominal cramping but is not having any discomfort today.No dysuria     Past Medical History  Diagnosis Date  . Thyroid disease   . Schizophrenia (HCC)   . Fibromyalgia   . Prediabetes   . Head ache   . DVT (deep venous thrombosis) (HCC)   . Diabetes mellitus without complication (HCC)     There are no active problems to display for this patient.   No past surgical history on file.  Current Outpatient Rx  Name  Route  Sig  Dispense  Refill  . acetaminophen (TYLENOL) 500 MG tablet   Oral   Take 500 mg by mouth every 6 (six) hours as needed.         Marland Kitchen. aspirin EC 81 MG tablet   Oral   Take 81 mg by mouth daily.         . Atorvastatin Calcium (LIPITOR PO)   Oral   Take by mouth.         . diclofenac (VOLTAREN) 75 MG EC tablet   Oral   Take 75 mg by mouth 2 (two) times daily.         Marland Kitchen. erythromycin ophthalmic ointment      Place a 1/2 inch ribbon of ointment into the lower eyelid.   1 g   0   . gabapentin (NEURONTIN) 100 MG capsule   Oral   Take 100 mg by mouth 3 (three) times daily.         Marland Kitchen. HYDROcodone-acetaminophen (NORCO) 5-325 MG tablet   Oral   Take 1 tablet by mouth every 8 (eight) hours as needed for moderate pain.   12 tablet   0   . levothyroxine (SYNTHROID, LEVOTHROID) 137 MCG tablet   Oral   Take 137 mcg by  mouth daily before breakfast.         . metFORMIN (GLUCOPHAGE) 500 MG tablet   Oral   Take by mouth 2 (two) times daily with a meal.         . metroNIDAZOLE (FLAGYL) 500 MG tablet   Oral   Take 1 tablet (500 mg total) by mouth 2 (two) times daily.   14 tablet   0   . mupirocin ointment (BACTROBAN) 2 %   Nasal   Place 1 application into the nose 2 (two) times daily.         Marland Kitchen. nystatin (MYCOSTATIN) 100000 UNIT/ML suspension   Oral   Take 5 mLs by mouth 4 (four) times daily.         Marland Kitchen. OLANZapine zydis (ZYPREXA) 10 MG disintegrating tablet   Oral   Take 10 mg by mouth at bedtime.         Marland Kitchen. tiZANidine (ZANAFLEX) 4 MG capsule   Oral   Take 1 capsule (4 mg total) by mouth 3 (three) times daily as needed for muscle spasms.   12 capsule  0     Allergies Review of patient's allergies indicates no known allergies.  No family history on file.  Social History Social History  Substance Use Topics  . Smoking status: Former Games developer  . Smokeless tobacco: Not on file  . Alcohol Use: Yes    Review of Systems  Constitutional: Negative for fever. Eyes: Negative for redness ENT: Negative for sore throat Cardiovascular: Negative for Palpitations Respiratory: Negative for Cough Gastrointestinal: As above Genitourinary: Negative for dysuria. No vaginal discharge Musculoskeletal: Negative for back pain. Skin: Negative for rash. Neurological: Negative for headache Psychiatric: As in for anxiety    ____________________________________________   PHYSICAL EXAM:  VITAL SIGNS: ED Triage Vitals  Enc Vitals Group     BP 12/28/15 1018 159/97 mmHg     Pulse Rate 12/28/15 1018 88     Resp 12/28/15 1018 18     Temp 12/28/15 1018 97.5 F (36.4 C)     Temp Source 12/28/15 1018 Oral     SpO2 12/28/15 1018 95 %     Weight 12/28/15 1018 232 lb (105.235 kg)     Height 12/28/15 1018  (1.702 m)     Head Cir --      Peak Flow --      Pain Score 12/28/15 1312 8      Pain Loc --      Pain Edu? --      Excl. in GC? --      Constitutional: Alert and oriented.Patient is no acute distress, she is highly anxious Eyes: Conjunctivae are normal. No erythema or injection ENT   Head: Normocephalic and atraumatic.   Mouth/Throat: Mucous membranes are moist. Cardiovascular: Normal rate, regular rhythm.  Respiratory: Normal respiratory effort without tachypnea nor retractions. Gastrointestinal: Soft and non-tender in all quadrants. No distention. There is no CVA tenderness. Genitourinary: No foreign body noted in vaginal canal, no CMT, no discharge. Musculoskeletal: Nontender with normal range of motion in all extremities.  Neurologic:  Normal speech and language. No gross focal neurologic deficits are appreciated. Skin:  Skin is warm, dry and intact. No rash noted. Psychiatric: Mood and affect are normal. Patient exhibits appropriate insight and judgment.  ____________________________________________    LABS (pertinent positives/negatives)  Labs Reviewed  COMPREHENSIVE METABOLIC PANEL - Abnormal; Notable for the following:    Glucose, Bld 108 (*)    Total Protein 8.2 (*)    All other components within normal limits  CBC - Abnormal; Notable for the following:    WBC 12.4 (*)    All other components within normal limits  URINALYSIS COMPLETEWITH MICROSCOPIC ([facility] ONLY) - Abnormal; Notable for the following:    Color, Urine STRAW (*)    APPearance CLEAR (*)    Specific Gravity, Urine 1.003 (*)    Hgb urine dipstick 1+ (*)    Leukocytes, UA 1+ (*)    Squamous Epithelial / LPF 0-5 (*)    All other components within normal limits  LIPASE, BLOOD    ____________________________________________   EKG  None  ____________________________________________    RADIOLOGY  None  ____________________________________________   PROCEDURES  Procedure(s) performed: none  Critical Care  performed:none  ____________________________________________   INITIAL IMPRESSION / ASSESSMENT AND PLAN / ED COURSE  Pertinent labs & imaging results that were available during my care of the patient were reviewed by me and considered in my medical decision making (see chart for details).  Patient well. No distress. She has no abdominal tenderness to palpation. There is no vaginal  retained foreign body. Her urinalysis is normal. Lab work is reassuring. She is greatly relieved that there was no condom in her vagina. I'll discharge her with outpatient follow up as needed.  ____________________________________________   FINAL CLINICAL IMPRESSION(S) / ED DIAGNOSES  Final diagnoses:  Slow transit constipation  Generalized abdominal pain          Jene Every, MD 12/28/15 [phone]

## 2016-03-26 ENCOUNTER — Ambulatory Visit
Admission: EM | Admit: 2016-03-26 | Discharge: 2016-03-26 | Disposition: A | Discharge status: Home / Self Care | Payer: Medicaid Other | Attending: Family Medicine | Admitting: Family Medicine

## 2016-03-26 ENCOUNTER — Encounter: Payer: Self-pay | Admitting: Gynecology

## 2016-03-26 DIAGNOSIS — E119 Type 2 diabetes mellitus without complications: Secondary | ICD-10-CM | POA: Diagnosis not present

## 2016-03-26 DIAGNOSIS — E079 Disorder of thyroid, unspecified: Secondary | ICD-10-CM | POA: Insufficient documentation

## 2016-03-26 DIAGNOSIS — Z7982 Long term (current) use of aspirin: Secondary | ICD-10-CM | POA: Diagnosis not present

## 2016-03-26 DIAGNOSIS — F209 Schizophrenia, unspecified: Secondary | ICD-10-CM | POA: Insufficient documentation

## 2016-03-26 DIAGNOSIS — Z79899 Other long term (current) drug therapy: Secondary | ICD-10-CM | POA: Diagnosis not present

## 2016-03-26 DIAGNOSIS — Z7984 Long term (current) use of oral hypoglycemic drugs: Secondary | ICD-10-CM | POA: Insufficient documentation

## 2016-03-26 DIAGNOSIS — Z87891 Personal history of nicotine dependence: Secondary | ICD-10-CM | POA: Insufficient documentation

## 2016-03-26 DIAGNOSIS — M797 Fibromyalgia: Secondary | ICD-10-CM | POA: Diagnosis not present

## 2016-03-26 DIAGNOSIS — J029 Acute pharyngitis, unspecified: Secondary | ICD-10-CM | POA: Diagnosis not present

## 2016-03-26 DIAGNOSIS — Z86718 Personal history of other venous thrombosis and embolism: Secondary | ICD-10-CM | POA: Diagnosis not present

## 2016-03-26 LAB — RAPID STREP SCREEN (MED CTR MEBANE ONLY): Streptococcus, Group A Screen (Direct): NEGATIVE

## 2016-03-26 MED ORDER — LIDOCAINE VISCOUS 2 % MT SOLN: OROMUCOSAL | Status: DC

## 2016-03-26 NOTE — ED Notes (Signed)
Per patient drainage/ not sure if it is from seeing her doctor for her thyroid problem / not sure from eating popcorn.

## 2016-03-26 NOTE — ED Provider Notes (Signed)
CSN: [id number]     Arrival date & time 03/26/16  1047 History   First MD Initiated Contact with Patient 03/26/16 1132     Chief Complaint  Patient presents with  . Sore Throat   (Consider location/radiation/quality/duration/timing/severity/associated sxs/prior Treatment) Patient is a 50 y.o. female presenting with URI. The history is provided by the patient.  URI Presenting symptoms: fatigue and sore throat   Presenting symptoms: no congestion, no cough, no facial pain, no fever and no rhinorrhea   Severity:  Moderate Onset quality:  Sudden Timing:  Constant Chronicity:  New Relieved by:  Nothing Ineffective treatments:  OTC medications Associated symptoms: no headaches, no myalgias, no sinus pain, no sneezing and no wheezing   Risk factors: not elderly, no chronic cardiac disease, no chronic kidney disease, no chronic respiratory disease, no diabetes mellitus, no immunosuppression, no recent illness, no recent travel and no sick contacts     Past Medical History  Diagnosis Date  . Thyroid disease   . Schizophrenia (HCC)   . Fibromyalgia   . Prediabetes   . Head ache   . DVT (deep venous thrombosis) (HCC)   . Diabetes mellitus without complication (HCC)    History reviewed. No pertinent past surgical history. No family history on file. Social History  Substance Use Topics  . Smoking status: Former Games developermoker  . Smokeless tobacco: None  . Alcohol Use: Yes   OB History    No data available     Review of Systems  Constitutional: Positive for fatigue. Negative for fever.  HENT: Positive for sore throat. Negative for congestion, rhinorrhea and sneezing.   Respiratory: Negative for cough and wheezing.   Musculoskeletal: Negative for myalgias.  Neurological: Negative for headaches.    Allergies  Review of patient's allergies indicates no known allergies.  Home Medications   Prior to Admission medications   Medication Sig Start Date End Date Taking? Authorizing Provider   acetaminophen (TYLENOL) 500 MG tablet Take 500 mg by mouth every 6 (six) hours as needed.   Yes Historical Provider, MD  aspirin EC 81 MG tablet Take 81 mg by mouth daily.   Yes Historical Provider, MD  Atorvastatin Calcium (LIPITOR PO) Take by mouth.   Yes Historical Provider, MD  diclofenac (VOLTAREN) 75 MG EC tablet Take 75 mg by mouth 2 (two) times daily.   Yes Historical Provider, MD  erythromycin ophthalmic ointment Place a 1/2 inch ribbon of ointment into the lower eyelid. 08/14/15  Yes Hassan RowanEugene Wade, MD  gabapentin (NEURONTIN) 100 MG capsule Take 100 mg by mouth 3 (three) times daily.   Yes Historical Provider, MD  levothyroxine (SYNTHROID, LEVOTHROID) 137 MCG tablet Take 137 mcg by mouth daily before breakfast.   Yes Historical Provider, MD  metFORMIN (GLUCOPHAGE) 500 MG tablet Take by mouth 2 (two) times daily with a meal.   Yes Historical Provider, MD  metroNIDAZOLE (FLAGYL) 500 MG tablet Take 1 tablet (500 mg total) by mouth 2 (two) times daily. 11/05/15  Yes Lutricia FeilWilliam P Roemer, PA-C  mupirocin ointment (BACTROBAN) 2 % Place 1 application into the nose 2 (two) times daily.   Yes Historical Provider, MD  nystatin (MYCOSTATIN) 100000 UNIT/ML suspension Take 5 mLs by mouth 4 (four) times daily.   Yes Historical Provider, MD  OLANZapine zydis (ZYPREXA) 10 MG disintegrating tablet Take 10 mg by mouth at bedtime.   Yes Historical Provider, MD  tiZANidine (ZANAFLEX) 4 MG capsule Take 1 capsule (4 mg total) by mouth 3 (three) times daily as  needed for muscle spasms. 09/12/15  Yes Rae Halsted, PA-C  HYDROcodone-acetaminophen (NORCO) 5-325 MG tablet Take 1 tablet by mouth every 8 (eight) hours as needed for moderate pain. 08/14/15   Hassan Rowan, MD  lidocaine (XYLOCAINE) 2 % solution 20 ml gargle and spit q 6 hours prn sore throat 03/26/16   Payton Mccallum, MD   Meds Ordered and Administered this Visit  Medications - No data to display  BP 117/68 mmHg  Pulse 86  Temp(Src) 98.7 F (37.1 C) (Oral)   Resp 16  Ht  (1.727 m)  Wt 225 lb (102.059 kg)  BMI 34.22 kg/m2  SpO2 98%  LMP  (Within Years) No data found.   Physical Exam  Constitutional: She appears well-developed and well-nourished. No distress.  HENT:  Head: Normocephalic and atraumatic.  Right Ear: Tympanic membrane, external ear and ear canal normal.  Left Ear: Tympanic membrane, external ear and ear canal normal.  Nose: No mucosal edema, rhinorrhea, nose lacerations, sinus tenderness, nasal deformity, septal deviation or nasal septal hematoma. No epistaxis.  No foreign bodies.  Mouth/Throat: Uvula is midline and mucous membranes are normal. Posterior oropharyngeal erythema present. No oropharyngeal exudate, posterior oropharyngeal edema or tonsillar abscesses.  Eyes: Conjunctivae and EOM are normal. Pupils are equal, round, and reactive to light. Right eye exhibits no discharge. Left eye exhibits no discharge. No scleral icterus.  Neck: Normal range of motion. Neck supple. No thyromegaly present.  Cardiovascular: Normal rate, regular rhythm and normal heart sounds.   Pulmonary/Chest: Effort normal and breath sounds normal. No respiratory distress. She has no wheezes. She has no rales.  Lymphadenopathy:    She has no cervical adenopathy.  Skin: She is not diaphoretic.  Nursing note and vitals reviewed.   ED Course  Procedures (including critical care time)  Labs Review Labs Reviewed  RAPID STREP SCREEN (NOT AT [facility])  CULTURE, GROUP A STREP [facility])    Imaging Review No results found.   Visual Acuity Review  Right Eye Distance:   Left Eye Distance:   Bilateral Distance:    Right Eye Near:   Left Eye Near:    Bilateral Near:         MDM   1. Pharyngitis    Discharge Medication List as of 03/26/2016 12:13 PM    START taking these medications   Details  lidocaine (XYLOCAINE) 2 % solution 20 ml gargle and spit q 6 hours prn sore throat, Normal       1. Lab results and diagnosis reviewed with  patient 2. rx as per orders above; reviewed possible side effects, interactions, risks and benefits  3. Recommend supportive treatment with otc analgesics, gargles 4. Follow-up prn if symptoms worsen or don't improve  Payton Mccallum, MD 03/26/16 1222

## 2016-03-26 NOTE — Discharge Instructions (Signed)

## 2016-03-29 LAB — CULTURE, GROUP A STREP (THRC)

## 2016-04-25 ENCOUNTER — Ambulatory Visit: Payer: Medicaid Other

## 2016-04-25 ENCOUNTER — Ambulatory Visit
Admission: EM | Admit: 2016-04-25 | Discharge: 2016-04-25 | Disposition: A | Discharge status: Home / Self Care | Payer: Medicaid Other | Attending: Internal Medicine | Admitting: Internal Medicine

## 2016-04-25 DIAGNOSIS — M797 Fibromyalgia: Secondary | ICD-10-CM | POA: Insufficient documentation

## 2016-04-25 DIAGNOSIS — E079 Disorder of thyroid, unspecified: Secondary | ICD-10-CM | POA: Diagnosis not present

## 2016-04-25 DIAGNOSIS — F209 Schizophrenia, unspecified: Secondary | ICD-10-CM | POA: Insufficient documentation

## 2016-04-25 DIAGNOSIS — R1032 Left lower quadrant pain: Secondary | ICD-10-CM | POA: Diagnosis present

## 2016-04-25 DIAGNOSIS — Z7982 Long term (current) use of aspirin: Secondary | ICD-10-CM | POA: Diagnosis not present

## 2016-04-25 DIAGNOSIS — Z79899 Other long term (current) drug therapy: Secondary | ICD-10-CM | POA: Diagnosis not present

## 2016-04-25 DIAGNOSIS — K5901 Slow transit constipation: Secondary | ICD-10-CM | POA: Diagnosis not present

## 2016-04-25 DIAGNOSIS — E119 Type 2 diabetes mellitus without complications: Secondary | ICD-10-CM | POA: Diagnosis not present

## 2016-04-25 DIAGNOSIS — Z87891 Personal history of nicotine dependence: Secondary | ICD-10-CM | POA: Diagnosis not present

## 2016-04-25 DIAGNOSIS — Z7984 Long term (current) use of oral hypoglycemic drugs: Secondary | ICD-10-CM | POA: Diagnosis not present

## 2016-04-25 MED ORDER — POLYETHYLENE GLYCOL 3350 17 G PO PACK: 17.0000 g | PACK | Freq: Two times a day (BID) | ORAL | Status: AC

## 2016-04-25 NOTE — ED Provider Notes (Signed)
CSN: [id number]     Arrival date & time 04/25/16  1719 History   First MD Initiated Contact with Patient 04/25/16 1741     Chief Complaint  Patient presents with  . Back Pain  . Abdominal Pain   HPI  50 year old lady who presents today with the onset of sharp left lower quadrant pains this afternoon, while watching TV. She takes Zyprexa, it's very helpful, and uses MiraLAX to manage her bowels.  She typically takes this 3 days on and 3 days off. She is worried about an intestinal infection, has an issue with her well water right now. No fever. No diarrhea. Last bowel movement was this morning, and was normal for her. No unusual vag discharge/bleeding; no urinary symptoms, no dysuria.  Has chronic frequency, drinks lots of water. Has had low back discomfort for ?several weeks, saw a [facility] provider yesterday and had unremarkable UA.   Past Medical History  Diagnosis Date  . Thyroid disease   . Schizophrenia (HCC)   . Fibromyalgia   . Prediabetes   . Head ache   . DVT (deep venous thrombosis) (HCC)   . Diabetes mellitus without complication [facility])    Past surgical history: R salpingectomy; breast biopsy Family history :  Cancer, DM, HTN Social History  Substance Use Topics  . Smoking status: Former Games developer  . Smokeless tobacco: None  . Alcohol Use: Yes    Review of Systems  All other systems reviewed and are negative.   Allergies  Review of patient's allergies indicates no known allergies.  Home Medications   Prior to Admission medications   Medication Sig Start Date End Date Taking? Authorizing Provider  aspirin EC 81 MG tablet Take 81 mg by mouth daily.   Yes Historical Provider, MD  Atorvastatin Calcium (LIPITOR PO) Take by mouth.   Yes Historical Provider, MD  diclofenac (VOLTAREN) 75 MG EC tablet Take 75 mg by mouth 2 (two) times daily.   Yes Historical Provider, MD  gabapentin (NEURONTIN) 100 MG capsule Take 100 mg by mouth 3 (three) times daily.   Yes  Historical Provider, MD  metFORMIN (GLUCOPHAGE) 500 MG tablet Take by mouth 2 (two) times daily with a meal.   Yes Historical Provider, MD  nystatin (MYCOSTATIN) 100000 UNIT/ML suspension Take 5 mLs by mouth 4 (four) times daily.   Yes Historical Provider, MD  OLANZapine zydis (ZYPREXA) 10 MG disintegrating tablet Take 10 mg by mouth at bedtime.   Yes Historical Provider, MD  acetaminophen (TYLENOL) 500 MG tablet Take 500 mg by mouth every 6 (six) hours as needed.    Historical Provider, MD  levothyroxine (SYNTHROID, LEVOTHROID) 137 MCG tablet Take 137 mcg by mouth daily before breakfast.    Historical Provider, MD  mupirocin ointment (BACTROBAN) 2 % Place 1 application into the nose 2 (two) times daily.    Historical Provider, MD      BP 121/76 mmHg  Pulse 73  Temp(Src) 98.2 F (36.8 C) (Oral)  Resp 18  SpO2 96%  LMP  (Within Years) Physical Exam  Constitutional: She is oriented to person, place, and time. No distress.  Alert, nicely groomed  HENT:  Head: Atraumatic.  Eyes:  Conjugate gaze, no eye redness/drainage  Neck: Neck supple.  Cardiovascular: Normal rate and regular rhythm.   Pulmonary/Chest: No respiratory distress. She has no wheezes. She has no rales.  Lungs clear, symmetric breath sounds  Abdominal: Soft. She exhibits no distension. There is no rebound and no guarding.  Mild discomfort to deep palpation to the left of the umbilicus.  Musculoskeletal: Normal range of motion.  No leg swelling  Neurological: She is alert and oriented to person, place, and time.  Skin: Skin is warm and dry.  No cyanosis  Nursing note and vitals reviewed.   ED Course  Procedures (including critical care time)  Imaging review:  Dg Abd Acute W/chest  04/25/2016  CLINICAL DATA:  Lower abdominal pain and shortness of breath EXAM: DG ABDOMEN ACUTE W/ 1V CHEST COMPARISON:  Chest radiograph Feb 24, 2015 FINDINGS: PA chest: There is no edema or consolidation. The heart size and pulmonary  vascularity are normal. No adenopathy. Supine and upright abdomen: There is diffuse stool throughout the colon. There is no bowel dilatation or air-fluid level suggesting obstruction. No free air. There are phleboliths in the pelvis. IMPRESSION: Diffuse stool throughout colon. Suspect a degree of constipation. No bowel obstruction or free air. No lung edema or consolidation. Electronically Signed   By: Bretta BangWilliam  Woodruff III M.D.   On: 04/25/2016 18:32     MDM   1. Constipation due to slow transit    Meds ordered this encounter  Medications  . polyethylene glycol (MIRALAX) packet    Sig: Take 17 g by mouth 2 (two) times daily.    Dispense:  100 each    Refill:  0   Prescription for miralax sent to the [facility].  Might need to take miralax twice daily for several days, then resume taking once daily.    Eustace MooreLaura W Arlette Schaad, MD 04/26/16 1330

## 2016-04-25 NOTE — Discharge Instructions (Signed)
Prescription for miralax sent to the [facility].  Might need to take miralax twice daily for several days, then resume taking once daily.

## 2016-04-25 NOTE — ED Notes (Signed)
Back pain and sharp, intermittent LLQ reported.   Reports some use of water in her house and was advised to not use her well water "for a couple of hours".  And reports ingesting some water accidentally during that time. Also reports inability to take a deep breath without pain.  Other multiple issues reported.

## 2016-04-26 ENCOUNTER — Other Ambulatory Visit: Payer: Self-pay | Admitting: Family Medicine

## 2016-04-26 DIAGNOSIS — Z1239 Encounter for other screening for malignant neoplasm of breast: Secondary | ICD-10-CM

## 2016-05-03 ENCOUNTER — Ambulatory Visit: Admission: RE | Admit: 2016-05-03 | Payer: Medicaid Other | Source: Ambulatory Visit

## 2016-06-08 ENCOUNTER — Ambulatory Visit
Admission: RE | Admit: 2016-06-08 | Discharge: 2016-06-08 | Disposition: A | Discharge status: Home / Self Care | Payer: Medicaid Other | Source: Ambulatory Visit | Attending: Family Medicine | Admitting: Family Medicine

## 2016-06-08 DIAGNOSIS — Z1231 Encounter for screening mammogram for malignant neoplasm of breast: Secondary | ICD-10-CM | POA: Diagnosis not present

## 2016-06-08 DIAGNOSIS — Z1239 Encounter for other screening for malignant neoplasm of breast: Secondary | ICD-10-CM

## 2016-08-25 ENCOUNTER — Ambulatory Visit
Admission: EM | Admit: 2016-08-25 | Discharge: 2016-08-25 | Disposition: A | Discharge status: Home / Self Care | Payer: Medicaid Other | Attending: Family Medicine | Admitting: Family Medicine

## 2016-08-25 DIAGNOSIS — R05 Cough: Secondary | ICD-10-CM | POA: Diagnosis not present

## 2016-08-25 DIAGNOSIS — R059 Cough, unspecified: Secondary | ICD-10-CM

## 2016-08-25 DIAGNOSIS — J0100 Acute maxillary sinusitis, unspecified: Secondary | ICD-10-CM

## 2016-08-25 MED ORDER — AMOXICILLIN 875 MG PO TABS: 875.0000 mg | ORAL_TABLET | Freq: Two times a day (BID) | ORAL | 0 refills | Status: DC

## 2016-08-25 MED ORDER — GUAIFENESIN-CODEINE 100-10 MG/5ML PO SOLN: 10.0000 mL | Freq: Four times a day (QID) | ORAL | 0 refills | Status: DC | PRN

## 2016-08-25 NOTE — ED Provider Notes (Signed)
[facility]    CSN: [id number] Arrival date & time: 08/25/16  1452     History   Chief Complaint Chief Complaint  Patient presents [id number]  . Cough    HPI Andrea [name] is a 50 y.o. female.   The history is provided by the patient.  URI  Presenting symptoms: congestion, cough, facial pain, fever and sore throat   Severity:  Moderate Onset quality:  Sudden Duration:  1 week Timing:  Constant Progression:  Worsening Chronicity:  New Relieved by:  Nothing Ineffective treatments:  OTC medications Associated symptoms: headaches and sinus pain   Associated symptoms: no sneezing and no wheezing   Risk factors: diabetes mellitus   Risk factors: not elderly, no chronic cardiac disease, no chronic kidney disease, no chronic respiratory disease, no immunosuppression, no recent illness, no recent travel and no sick contacts     Past Medical History:  Diagnosis Date  . Diabetes mellitus without complication (HCC)   . DVT (deep venous thrombosis) (HCC)   . Fibromyalgia   . Head ache   . Prediabetes   . Schizophrenia (HCC)   . Thyroid disease     There are no active problems to display for this patient.   Past Surgical History:  Procedure Laterality Date  . BREAST CYST ASPIRATION Left    long long time ago    OB History    No data available       Home Medications    Prior to Admission medications   Medication Sig Start Date End Date Taking? Authorizing Provider  acetaminophen (TYLENOL) 500 MG tablet Take 500 mg by mouth every 6 (six) hours as needed.   Yes Historical Provider, MD  aspirin EC 81 MG tablet Take 81 mg by mouth daily.   Yes Historical Provider, MD  Atorvastatin Calcium (LIPITOR PO) Take by mouth.   Yes Historical Provider, MD  gabapentin (NEURONTIN) 100 MG capsule Take 100 mg by mouth 3 (three) times daily.   Yes Historical Provider, MD  levothyroxine (SYNTHROID, LEVOTHROID) 137 MCG tablet Take 137 mcg by mouth daily before breakfast.    Yes Historical Provider, MD  metFORMIN (GLUCOPHAGE) 500 MG tablet Take by mouth 2 (two) times daily with a meal.   Yes Historical Provider, MD  OLANZapine zydis (ZYPREXA) 10 MG disintegrating tablet Take 10 mg by mouth at bedtime.   Yes Historical Provider, MD  polyethylene glycol (MIRALAX) packet Take 17 g by mouth 2 (two) times daily. 04/25/16  Yes Eustace MooreLaura W Murray, MD  tiZANidine (ZANAFLEX) 4 MG capsule Take 1 capsule (4 mg total) by mouth 3 (three) times daily as needed for muscle spasms. 09/12/15  Yes Rae HalstedLaurie W Lee, PA-C  amoxicillin (AMOXIL) 875 MG tablet Take 1 tablet (875 mg total) by mouth 2 (two) times daily. 08/25/16   Payton Mccallumrlando Travell Desaulniers, MD  diclofenac (VOLTAREN) 75 MG EC tablet Take 75 mg by mouth 2 (two) times daily.    Historical Provider, MD  erythromycin ophthalmic ointment Place a 1/2 inch ribbon of ointment into the lower eyelid. 08/14/15   Hassan RowanEugene Wade, MD  guaiFENesin-codeine 100-10 MG/5ML syrup Take 10 mLs by mouth every 6 (six) hours as needed for cough. 08/25/16   Payton Mccallumrlando Vessie Olmsted, MD  HYDROcodone-acetaminophen (NORCO) 5-325 MG tablet Take 1 tablet by mouth every 8 (eight) hours as needed for moderate pain. 08/14/15   Hassan RowanEugene Wade, MD  lidocaine (XYLOCAINE) 2 % solution 20 ml gargle and spit q 6 hours prn sore throat 03/26/16   [facility]  Jourdin Gens, MD  metroNIDAZOLE (FLAGYL) 500 MG tablet Take 1 tablet (500 mg total) by mouth 2 (two) times daily. 11/05/15   Lutricia FeilWilliam P Roemer, PA-C  mupirocin ointment (BACTROBAN) 2 % Place 1 application into the nose 2 (two) times daily.    Historical Provider, MD  nystatin (MYCOSTATIN) 100000 UNIT/ML suspension Take 5 mLs by mouth 4 (four) times daily.    Historical Provider, MD    Family History History reviewed. No pertinent family history.  Social History Social History  Substance Use Topics  . Smoking status: Former Games developermoker  . Smokeless tobacco: Never Used  . Alcohol use Yes     Allergies   Patient has no known allergies.   Review of  Systems Review of Systems  Constitutional: Positive for fever.  HENT: Positive for congestion, sinus pain and sore throat. Negative for sneezing.   Respiratory: Positive for cough. Negative for wheezing.   Neurological: Positive for headaches.     Physical Exam Triage Vital Signs ED Triage Vitals  Enc Vitals Group     BP 08/25/16 1600 121/68     Pulse Rate 08/25/16 1600 84     Resp 08/25/16 1600 17     Temp 08/25/16 1600 98.6 F (37 C)     Temp Source 08/25/16 1600 Oral     SpO2 08/25/16 1600 97 %     Weight 08/25/16 1555 220 lb (99.8 kg)     Height 08/25/16 1555 5\' 8"  (1.727 m)     Head Circumference --      Peak Flow --      Pain Score 08/25/16 1558 8     Pain Loc --      Pain Edu? --      Excl. in GC? --    No data found.   Updated Vital Signs BP 121/68 (BP Location: Left Arm)   Pulse 84   Temp 98.6 F (37 C) (Oral)   Resp 17   Ht 5\' 8"  (1.727 m)   Wt 220 lb (99.8 kg)   LMP  (Within Years)   SpO2 97%   BMI 33.45 kg/m   Visual Acuity Right Eye Distance:   Left Eye Distance:   Bilateral Distance:    Right Eye Near:   Left Eye Near:    Bilateral Near:     Physical Exam  Constitutional: She appears well-developed and well-nourished. No distress.  HENT:  Head: Normocephalic and atraumatic.  Right Ear: Tympanic membrane, external ear and ear canal normal.  Left Ear: Tympanic membrane, external ear and ear canal normal.  Nose: Mucosal edema and rhinorrhea present. No nose lacerations, sinus tenderness, nasal deformity, septal deviation or nasal septal hematoma. No epistaxis.  No foreign bodies. Right sinus exhibits maxillary sinus tenderness and frontal sinus tenderness. Left sinus exhibits maxillary sinus tenderness and frontal sinus tenderness.  Mouth/Throat: Uvula is midline, oropharynx is clear and moist and mucous membranes are normal. No oropharyngeal exudate.  Eyes: Conjunctivae and EOM are normal. Pupils are equal, round, and reactive to light. Right  eye exhibits no discharge. Left eye exhibits no discharge. No scleral icterus.  Neck: Normal range of motion. Neck supple. No thyromegaly present.  Cardiovascular: Normal rate, regular rhythm and normal heart sounds.   Pulmonary/Chest: Effort normal and breath sounds normal. No respiratory distress. She has no wheezes. She has no rales.  Lymphadenopathy:    She has no cervical adenopathy.  Skin: She is not diaphoretic.  Nursing note and vitals reviewed.    UC  Treatments / Results  Labs (all labs ordered are listed, but only abnormal results are displayed) Labs Reviewed - No data to display  EKG  EKG Interpretation None       Radiology No results found.  Procedures Procedures (including critical care time)  Medications Ordered in UC Medications - No data to display   Initial Impression / Assessment and Plan / UC Course  I have reviewed the triage vital signs and the nursing notes.  Pertinent labs & imaging results that were available during my care of the patient were reviewed by me and considered in my medical decision making (see chart for details).  Clinical Course       Final Clinical Impressions(s) / UC Diagnoses   Final diagnoses:  Acute maxillary sinusitis, recurrence not specified  Cough    New Prescriptions Discharge Medication List as of 08/25/2016  5:05 PM    START taking these medications   Details  amoxicillin (AMOXIL) 875 MG tablet Take 1 tablet (875 mg total) by mouth 2 (two) times daily., Starting Fri 08/25/2016, Normal    guaiFENesin-codeine 100-10 MG/5ML syrup Take 10 mLs by mouth every 6 (six) hours as needed for cough., Starting Fri 08/25/2016, Print       1. diagnosis reviewed with patient 2. rx as per orders above; reviewed possible side effects, interactions, risks and benefits  3. Follow-up prn if symptoms worsen or don't improve   Payton Mccallum, MD 08/25/16 [phone]

## 2016-08-25 NOTE — ED Triage Notes (Signed)
Patient complains of chest congestion and cough x 1 week. Patient states that she has been trying to use mucinex but has had no relief.

## 2016-09-30 ENCOUNTER — Ambulatory Visit
Admission: EM | Admit: 2016-09-30 | Discharge: 2016-09-30 | Disposition: A | Discharge status: Home / Self Care | Payer: Medicaid Other | Attending: Family Medicine | Admitting: Family Medicine

## 2016-09-30 ENCOUNTER — Encounter: Payer: Self-pay | Admitting: Gynecology

## 2016-09-30 ENCOUNTER — Ambulatory Visit: Payer: Medicaid Other

## 2016-09-30 DIAGNOSIS — R52 Pain, unspecified: Secondary | ICD-10-CM

## 2016-09-30 DIAGNOSIS — Z7984 Long term (current) use of oral hypoglycemic drugs: Secondary | ICD-10-CM | POA: Insufficient documentation

## 2016-09-30 DIAGNOSIS — M79661 Pain in right lower leg: Secondary | ICD-10-CM | POA: Diagnosis present

## 2016-09-30 DIAGNOSIS — Z79899 Other long term (current) drug therapy: Secondary | ICD-10-CM | POA: Diagnosis not present

## 2016-09-30 DIAGNOSIS — E079 Disorder of thyroid, unspecified: Secondary | ICD-10-CM | POA: Insufficient documentation

## 2016-09-30 DIAGNOSIS — Y93K1 Activity, walking an animal: Secondary | ICD-10-CM | POA: Diagnosis not present

## 2016-09-30 DIAGNOSIS — X58XXXA Exposure to other specified factors, initial encounter: Secondary | ICD-10-CM | POA: Diagnosis not present

## 2016-09-30 DIAGNOSIS — M797 Fibromyalgia: Secondary | ICD-10-CM | POA: Diagnosis not present

## 2016-09-30 DIAGNOSIS — S8011XA Contusion of right lower leg, initial encounter: Secondary | ICD-10-CM

## 2016-09-30 DIAGNOSIS — E119 Type 2 diabetes mellitus without complications: Secondary | ICD-10-CM | POA: Diagnosis not present

## 2016-09-30 DIAGNOSIS — Z7982 Long term (current) use of aspirin: Secondary | ICD-10-CM | POA: Diagnosis not present

## 2016-09-30 DIAGNOSIS — M79604 Pain in right leg: Secondary | ICD-10-CM

## 2016-09-30 DIAGNOSIS — F209 Schizophrenia, unspecified: Secondary | ICD-10-CM | POA: Insufficient documentation

## 2016-09-30 MED ORDER — NAPROXEN 500 MG PO TABS: 500.0000 mg | ORAL_TABLET | Freq: Two times a day (BID) | ORAL | 0 refills | Status: DC

## 2016-09-30 NOTE — ED Triage Notes (Signed)
Per patient her dog ran into her right leg x 1 week ago. Patient stated right leg painful and swollen.

## 2016-09-30 NOTE — ED Provider Notes (Signed)
CSN: [id number]     Arrival date & time 09/30/16  1048 History   First MD Initiated Contact with Patient 09/30/16 1117     Chief Complaint  Patient presents with  . Leg Pain   (Consider location/radiation/quality/duration/timing/severity/associated sxs/prior Treatment) Pt was walking her dog and it pulled on the chain causing pain and swelling to the rt lower leg x2 days ago. Pt is able to apply pressure but wanted to make sure that it was no infection of blood clot. Denies any posterior calf or leg pain. Has not taken anything for it.       Past Medical History:  Diagnosis Date  . Diabetes mellitus without complication (HCC)   . DVT (deep venous thrombosis) (HCC)   . Fibromyalgia   . Head ache   . Prediabetes   . Schizophrenia (HCC)   . Thyroid disease    Past Surgical History:  Procedure Laterality Date  . BREAST CYST ASPIRATION Left    long long time ago   No family history on file. Social History  Substance Use Topics  . Smoking status: Former Games developermoker  . Smokeless tobacco: Never Used  . Alcohol use Yes   OB History    No data available     Review of Systems  Constitutional: Negative.   Respiratory: Negative.   Cardiovascular: Negative.   Musculoskeletal:       RT mid calf pain and bruising.   Skin:       Yellow/ purple color bruising with minimal swelling to rt shin area   Neurological: Negative.     Allergies  Patient has no known allergies.  Home Medications   Prior to Admission medications   Medication Sig Start Date End Date Taking? Authorizing Provider  acetaminophen (TYLENOL) 500 MG tablet Take 500 mg by mouth every 6 (six) hours as needed.   Yes Historical Provider, MD  aspirin EC 81 MG tablet Take 81 mg by mouth daily.   Yes Historical Provider, MD  Atorvastatin Calcium (LIPITOR PO) Take by mouth.   Yes Historical Provider, MD  diclofenac (VOLTAREN) 75 MG EC tablet Take 75 mg by mouth 2 (two) times daily.   Yes Historical Provider, MD   erythromycin ophthalmic ointment Place a 1/2 inch ribbon of ointment into the lower eyelid. 08/14/15  Yes Hassan RowanEugene Wade, MD  gabapentin (NEURONTIN) 100 MG capsule Take 100 mg by mouth 3 (three) times daily.   Yes Historical Provider, MD  HYDROcodone-acetaminophen (NORCO) 5-325 MG tablet Take 1 tablet by mouth every 8 (eight) hours as needed for moderate pain. 08/14/15  Yes Hassan RowanEugene Wade, MD  levothyroxine (SYNTHROID, LEVOTHROID) 137 MCG tablet Take 137 mcg by mouth daily before breakfast.   Yes Historical Provider, MD  lidocaine (XYLOCAINE) 2 % solution 20 ml gargle and spit q 6 hours prn sore throat 03/26/16  Yes Payton Mccallumrlando Conty, MD  metFORMIN (GLUCOPHAGE) 500 MG tablet Take by mouth 2 (two) times daily with a meal.   Yes Historical Provider, MD  mupirocin ointment (BACTROBAN) 2 % Place 1 application into the nose 2 (two) times daily.   Yes Historical Provider, MD  nystatin (MYCOSTATIN) 100000 UNIT/ML suspension Take 5 mLs by mouth 4 (four) times daily.   Yes Historical Provider, MD  OLANZapine zydis (ZYPREXA) 10 MG disintegrating tablet Take 10 mg by mouth at bedtime.   Yes Historical Provider, MD  polyethylene glycol (MIRALAX) packet Take 17 g by mouth 2 (two) times daily. 04/25/16  Yes Eustace MooreLaura W Murray, MD  tiZANidine (ZANAFLEX)  4 MG capsule Take 1 capsule (4 mg total) by mouth 3 (three) times daily as needed for muscle spasms. 09/12/15  Yes Rae HalstedLaurie W Lee, PA-C  amoxicillin (AMOXIL) 875 MG tablet Take 1 tablet (875 mg total) by mouth 2 (two) times daily. 08/25/16   Payton Mccallumrlando Conty, MD  guaiFENesin-codeine 100-10 MG/5ML syrup Take 10 mLs by mouth every 6 (six) hours as needed for cough. 08/25/16   Payton Mccallumrlando Conty, MD  metroNIDAZOLE (FLAGYL) 500 MG tablet Take 1 tablet (500 mg total) by mouth 2 (two) times daily. 11/05/15   Lutricia FeilWilliam P Roemer, PA-C  naproxen (NAPROSYN) 500 MG tablet Take 1 tablet (500 mg total) by mouth 2 (two) times daily. 09/30/16   Tobi BastosMelanie A Marley Charlot, NP   Meds Ordered and Administered this  Visit  Medications - No data to display  BP 129/74 (BP Location: Left Arm)   Pulse 77   Temp 98.8 F (37.1 C) (Oral)   Resp 16   Wt 220 lb (99.8 kg)   LMP  (Within Years)   SpO2 98%   BMI 33.45 kg/m  No data found.   Physical Exam  Constitutional: She appears well-developed.  Cardiovascular: Normal rate.   Pulmonary/Chest: Effort normal and breath sounds normal.  Musculoskeletal: She exhibits edema and tenderness.  Rt anterior shin area yellow/ purple color bruising noted, - homans, no erythema noted. Strong pedal and popliteal pulses.   Skin: Capillary refill takes less than 2 seconds.  Yellow/ purple bruising with minimal swelling around shin     Urgent Care Course   Clinical Course     Procedures (including critical care time)  Labs Review Labs Reviewed - No data to display  Imaging Review Dg Tibia/fibula Right  Result Date: 09/30/2016 CLINICAL DATA:  Diffuse right lower leg pain and swelling since her dog ran into her leg 1 week ago. EXAM: RIGHT TIBIA AND FIBULA - 2 VIEW COMPARISON:  None. FINDINGS: There is no evidence of fracture or other focal bone lesions. Soft tissues are unremarkable. IMPRESSION: Normal examination. Electronically Signed   By: Beckie SaltsSteven  Reid M.D.   On: 09/30/2016 11:46             MDM   1. Contusion of right lower leg, initial encounter   2. Pain   3. Right leg pain    Apply warm and cool compresses May take tylenol or motrin for pain If area becomes red or hot to touch you will need to return or see your pcp     Tobi BastosMelanie A Jaylynne Birkhead, NP 09/30/16 1407

## 2017-03-04 ENCOUNTER — Ambulatory Visit
Admission: EM | Admit: 2017-03-04 | Discharge: 2017-03-04 | Disposition: A | Discharge status: Home / Self Care | Payer: Medicaid Other | Attending: Family Medicine | Admitting: Family Medicine

## 2017-03-04 DIAGNOSIS — S20369A Insect bite (nonvenomous) of unspecified front wall of thorax, initial encounter: Secondary | ICD-10-CM | POA: Diagnosis not present

## 2017-03-04 DIAGNOSIS — W57XXXA Bitten or stung by nonvenomous insect and other nonvenomous arthropods, initial encounter: Secondary | ICD-10-CM | POA: Diagnosis not present

## 2017-03-04 DIAGNOSIS — G44209 Tension-type headache, unspecified, not intractable: Secondary | ICD-10-CM | POA: Diagnosis not present

## 2017-03-04 MED ORDER — NAPROXEN 500 MG PO TABS: 500.0000 mg | ORAL_TABLET | Freq: Two times a day (BID) | ORAL | 0 refills | Status: DC

## 2017-03-04 MED ORDER — MUPIROCIN 2 % EX OINT: 1.0000 "application " | TOPICAL_OINTMENT | Freq: Three times a day (TID) | CUTANEOUS | 0 refills | Status: DC

## 2017-03-04 NOTE — ED Triage Notes (Signed)
Reporting took tick off mid sternal area this morning.  Headache.  "put mirasporin on it".  Woke up this morning and felt hot.

## 2017-03-04 NOTE — ED Provider Notes (Signed)
CSN: [id number]     Arrival date & time 03/04/17  1014 History   None    Chief Complaint  Patient presents with  . Headache  . Nausea   (Consider location/radiation/quality/duration/timing/severity/associated sxs/prior Treatment) HPI  This is a 51 year old female who presents with headache and subjective feelings of warmth. She became concerned after she pulled a tick off her midsternal area this morning. She thinks that she may not have completely pulled a tick have allowed to have some body parts remaining. She does have a dog in the household that is continuous subclavian tick protection. She does do some weeding in her yard and the lawn has grown very tall since her landlord has been on vacation and has not mowed recently. She is afebrile today she has had no rashes. She states that the tick was not engorged to look the size of a small pebble.  Has a history of headaches in the past. They're not migrainous. He appears to have a pressure sensation to it. She's been using Tylenol but has not been successful in abating the headache. His had some nausea. No vomiting. She's had no visual disturbances or any neurological symptoms.     Past Medical History:  Diagnosis Date  . Diabetes mellitus without complication (HCC)   . DVT (deep venous thrombosis) (HCC)   . Fibromyalgia   . Head ache   . Prediabetes   . Schizophrenia (HCC)   . Thyroid disease    Past Surgical History:  Procedure Laterality Date  . BREAST CYST ASPIRATION Left    long long time ago   History reviewed. No pertinent family history. Social History  Substance Use Topics  . Smoking status: Former Games developer  . Smokeless tobacco: Never Used  . Alcohol use Yes   OB History    No data available     Review of Systems  Constitutional: Positive for activity change. Negative for chills, fatigue and fever.  Skin: Positive for wound.  Neurological: Positive for headaches.  All other systems reviewed and are  negative.   Allergies  Patient has no known allergies.  Home Medications   Prior to Admission medications   Medication Sig Start Date End Date Taking? Authorizing Provider  acetaminophen (TYLENOL) 500 MG tablet Take 500 mg by mouth every 6 (six) hours as needed.    [provider]  amoxicillin (AMOXIL) 875 MG tablet Take 1 tablet (875 mg total) by mouth 2 (two) times daily. 08/25/16   Payton Mccallum, MD  aspirin EC 81 MG tablet Take 81 mg by mouth daily.    [provider]  Atorvastatin Calcium (LIPITOR PO) Take by mouth.    [provider]  diclofenac (VOLTAREN) 75 MG EC tablet Take 75 mg by mouth 2 (two) times daily.    [provider]  erythromycin ophthalmic ointment Place a 1/2 inch ribbon of ointment into the lower eyelid. 08/14/15   Hassan Rowan, MD  gabapentin (NEURONTIN) 100 MG capsule Take 100 mg by mouth 3 (three) times daily.    [provider]  guaiFENesin-codeine 100-10 MG/5ML syrup Take 10 mLs by mouth every 6 (six) hours as needed for cough. 08/25/16   Payton Mccallum, MD  HYDROcodone-acetaminophen (NORCO) 5-325 MG tablet Take 1 tablet by mouth every 8 (eight) hours as needed for moderate pain. 08/14/15   Hassan Rowan, MD  levothyroxine (SYNTHROID, LEVOTHROID) 137 MCG tablet Take 137 mcg by mouth daily before breakfast.    [provider]  lidocaine (XYLOCAINE) 2 %  solution 20 ml gargle and spit q 6 hours prn sore throat 03/26/16   Payton Mccallumonty, [place], MD  metFORMIN (GLUCOPHAGE) 500 MG tablet Take by mouth 2 (two) times daily with a meal.    [provider]  metroNIDAZOLE (FLAGYL) 500 MG tablet Take 1 tablet (500 mg total) by mouth 2 (two) times daily. 11/05/15   Lutricia Feiloemer, Eziah Negro P, PA-C  mupirocin ointment (BACTROBAN) 2 % Apply 1 application topically 3 (three) times daily. 03/04/17   Lutricia Feiloemer, Layali Freund P, PA-C  naproxen (NAPROSYN) 500 MG tablet Take 1 tablet (500 mg total) by mouth 2 (two) times daily. Take with food 03/04/17    Ovid Curdoemer, Chan Sheahan P, PA-C  nystatin (MYCOSTATIN) 100000 UNIT/ML suspension Take 5 mLs by mouth 4 (four) times daily.    [provider]  OLANZapine zydis (ZYPREXA) 10 MG disintegrating tablet Take 10 mg by mouth at bedtime.    [provider]  polyethylene glycol (MIRALAX) packet Take 17 g by mouth 2 (two) times daily. 04/25/16   Eustace MooreMurray, Laura W, MD  tiZANidine (ZANAFLEX) 4 MG capsule Take 1 capsule (4 mg total) by mouth 3 (three) times daily as needed for muscle spasms. 09/12/15   Rae HalstedLee, Laurie W, PA-C   Meds Ordered and Administered this Visit  Medications - No data to display  BP 111/70 (BP Location: Left Arm)   Pulse 72   Temp 97.7 F (36.5 C) (Oral)   Resp 18   Ht 5\' 7"  (1.702 m)   SpO2 99%  No data found.   Physical Exam  Constitutional: She is oriented to person, place, and time. She appears well-developed and well-nourished. No distress.  HENT:  Head: Normocephalic.  Eyes: Pupils are equal, round, and reactive to light. Right eye exhibits no discharge. Left eye exhibits no discharge.  Neck: Normal range of motion. Neck supple.  Musculoskeletal: Normal range of motion.  Lymphadenopathy:    She has no cervical adenopathy.  Neurological: She is alert and oriented to person, place, and time.  Skin: Skin is warm and dry. She is not diaphoretic.  Examination of the chest wall was performed with Herbert SetaHeather, RN as Nurse, children'schaperone and assistant. In the midline over the sternum the patient has one small area of a tick bite post removal. No evidence of infection. There is no evidence of tick parts remaining.  Psychiatric: She has a normal mood and affect. Her behavior is normal. Judgment and thought content normal.  Nursing note and vitals reviewed.   Urgent Care Course     Procedures (including critical care time)  Labs Review Labs Reviewed - No data to display  Imaging Review No results found.   Visual Acuity Review  Right Eye Distance:   Left Eye Distance:    Bilateral Distance:    Right Eye Near:   Left Eye Near:    Bilateral Near:         MDM   1. Tick bite of chest wall, unspecified laterality, initial encounter   2. Acute non intractable tension-type headache    New Prescriptions   MUPIROCIN OINTMENT (BACTROBAN) 2 %    Apply 1 application topically 3 (three) times daily.   NAPROXEN (NAPROSYN) 500 MG TABLET    Take 1 tablet (500 mg total) by mouth 2 (two) times daily. Take with food  Plan: 1. Test/x-ray results and diagnosis reviewed with patient 2. rx as per orders; risks, benefits, potential side effects reviewed with patient 3. Recommend supportive treatment with Keeping area clean and applying Bactroban ointment  3 times daily. I will give her Naprosyn for her headaches that she will take intermittently. She should use this only for active headaches and not take it continuously. She has any problems with fever or rash or worsening headaches and is concerned about tick fever she should return to clinic or go to a primary care physician. 4. F/u prn if symptoms worsen or don't improve     Lutricia Feil, PA-C 03/04/17 1204

## 2017-03-04 NOTE — ED Notes (Signed)
Pt in treatment room.  In NAD.  Needs address.  Pt/Fam updated on POC.   

## 2017-07-13 ENCOUNTER — Other Ambulatory Visit: Payer: Self-pay | Admitting: Medical Oncology

## 2017-07-13 DIAGNOSIS — Z1239 Encounter for other screening for malignant neoplasm of breast: Secondary | ICD-10-CM

## 2017-07-31 ENCOUNTER — Ambulatory Visit
Admission: EM | Admit: 2017-07-31 | Discharge: 2017-07-31 | Disposition: A | Discharge status: Home / Self Care | Payer: Medicaid Other | Attending: Family Medicine | Admitting: Family Medicine

## 2017-07-31 ENCOUNTER — Encounter: Payer: Self-pay | Admitting: Emergency Medicine

## 2017-07-31 DIAGNOSIS — Z79899 Other long term (current) drug therapy: Secondary | ICD-10-CM | POA: Insufficient documentation

## 2017-07-31 DIAGNOSIS — F209 Schizophrenia, unspecified: Secondary | ICD-10-CM | POA: Diagnosis not present

## 2017-07-31 DIAGNOSIS — Z87891 Personal history of nicotine dependence: Secondary | ICD-10-CM | POA: Insufficient documentation

## 2017-07-31 DIAGNOSIS — E079 Disorder of thyroid, unspecified: Secondary | ICD-10-CM | POA: Diagnosis not present

## 2017-07-31 DIAGNOSIS — E119 Type 2 diabetes mellitus without complications: Secondary | ICD-10-CM | POA: Diagnosis not present

## 2017-07-31 DIAGNOSIS — R1030 Lower abdominal pain, unspecified: Secondary | ICD-10-CM | POA: Diagnosis not present

## 2017-07-31 DIAGNOSIS — R109 Unspecified abdominal pain: Secondary | ICD-10-CM | POA: Diagnosis present

## 2017-07-31 DIAGNOSIS — Z7982 Long term (current) use of aspirin: Secondary | ICD-10-CM | POA: Diagnosis not present

## 2017-07-31 DIAGNOSIS — Z7984 Long term (current) use of oral hypoglycemic drugs: Secondary | ICD-10-CM | POA: Insufficient documentation

## 2017-07-31 DIAGNOSIS — R110 Nausea: Secondary | ICD-10-CM

## 2017-07-31 DIAGNOSIS — R1031 Right lower quadrant pain: Secondary | ICD-10-CM | POA: Diagnosis not present

## 2017-07-31 DIAGNOSIS — R350 Frequency of micturition: Secondary | ICD-10-CM | POA: Diagnosis not present

## 2017-07-31 DIAGNOSIS — M797 Fibromyalgia: Secondary | ICD-10-CM | POA: Diagnosis not present

## 2017-07-31 LAB — URINALYSIS, COMPLETE (UACMP) WITH MICROSCOPIC
BILIRUBIN URINE: NEGATIVE
Glucose, UA: NEGATIVE mg/dL
Hgb urine dipstick: NEGATIVE
KETONES UR: NEGATIVE mg/dL
LEUKOCYTES UA: NEGATIVE
NITRITE: NEGATIVE
Protein, ur: NEGATIVE mg/dL
RBC / HPF: NONE SEEN RBC/hpf (ref 0–5)
Specific Gravity, Urine: 1.01 (ref 1.005–1.030)
pH: 7 (ref 5.0–8.0)

## 2017-07-31 NOTE — Discharge Instructions (Signed)
You may have a cystocele. I would recommend seeing an Ob Gyn or urologist.  Follow up with your primary regarding your abdominal pain.  Take care   Dr. Adriana Simas

## 2017-07-31 NOTE — ED Triage Notes (Signed)
Patient is c/o right lower abdominal pain and urinary frequency x months. Patient states that she feels like she has a "growth" or "something coming out" of her vagina x several months. Patient states she just had a physical and there was a trace of blood in her urine. Patient did not tell her PCP about the vaginal problem.

## 2017-07-31 NOTE — ED Provider Notes (Signed)
[facility]    CSN: [id number] Arrival date & time: 07/31/17  1032  History   Chief Complaint Chief Complaint  Patient presents with  . Abdominal Pain   HPI  51 year old female with type 2 diabetes, schizophrenia presents with abdominal pain.  Patient reports that she's had ongoing lower abdominal pain, particularly the right lower quadrant for the past several months. It worsened last night and was severe, which prompted her visit today. She reports that she's had ongoing urinary frequency which has been going on for months as well. Associated nausea intermittently. No known inciting factor. No known exacerbating or relieving factors. She reports that she's not in any pain currently. Additionally, she states that she for the past few months she's also felt a protrudes from her vagina. She was too ashamed to tell as to her primary doctor. She wants this examined today. No associated fever or chills. No other plans or concerns at this time.  Past Medical History:  Diagnosis Date  . Diabetes mellitus without complication (HCC)   . DVT (deep venous thrombosis) (HCC)   . Fibromyalgia   . Head ache   . Prediabetes   . Schizophrenia (HCC)   . Thyroid disease    Past Surgical History:  Procedure Laterality Date  . BREAST CYST ASPIRATION Left    long long time ago   OB History    No data available     Home Medications    Prior to Admission medications   Medication Sig Start Date End Date Taking? Authorizing Provider  acetaminophen (TYLENOL) 500 MG tablet Take 500 mg by mouth every 6 (six) hours as needed.   Yes [provider]  aspirin EC 81 MG tablet Take 81 mg by mouth daily.   Yes [provider]  Atorvastatin Calcium (LIPITOR PO) Take by mouth.   Yes [provider]  gabapentin (NEURONTIN) 100 MG capsule Take 100 mg by mouth 3 (three) times daily.   Yes [provider]  levothyroxine (SYNTHROID, LEVOTHROID) 137 MCG tablet Take  137 mcg by mouth daily before breakfast.   Yes [provider]  metFORMIN (GLUCOPHAGE) 500 MG tablet Take by mouth 2 (two) times daily with a meal.   Yes [provider]  naproxen (NAPROSYN) 500 MG tablet Take 1 tablet (500 mg total) by mouth 2 (two) times daily. Take with food 03/04/17  Yes Ovid Curd P, PA-C  nystatin cream (MYCOSTATIN) Apply 1 application topically 2 (two) times daily.   Yes [provider]  OLANZapine zydis (ZYPREXA) 10 MG disintegrating tablet Take 10 mg by mouth at bedtime.   Yes [provider]  polyethylene glycol (MIRALAX) packet Take 17 g by mouth 2 (two) times daily. 04/25/16  Yes Eustace Moore, MD  diclofenac (VOLTAREN) 75 MG EC tablet Take 75 mg by mouth 2 (two) times daily.    [provider]   Family History History reviewed. No pertinent family history.  Social History Social History  Substance Use Topics  . Smoking status: Former Smoker    Quit date: 08/01/2007  . Smokeless tobacco: Never Used  . Alcohol use No   Allergies   Patient has no known allergies.  Review of Systems Review of Systems  Gastrointestinal: Positive for abdominal pain and nausea. Negative for vomiting.  Genitourinary: Positive for frequency.       Protrudence from vagina with urination.   Physical Exam Triage Vital Signs ED Triage Vitals  Enc Vitals Group  BP 07/31/17 1144 129/78     Pulse Rate 07/31/17 1144 67     Resp 07/31/17 1144 16     Temp 07/31/17 1144 97.9 F (36.6 C)     Temp Source 07/31/17 1144 Oral     SpO2 07/31/17 1144 100 %     Weight 07/31/17 1144 213 lb (96.6 kg)     Height 07/31/17 1144  (1.702 m)     Head Circumference --      Peak Flow --      Pain Score 07/31/17 1145 0     Pain Loc --      Pain Edu? --      Excl. in GC? --    Updated Vital Signs BP 129/78 (BP Location: Left Arm)   Pulse 67   Temp 97.9 F (36.6 C) (Oral)   Resp 16   Ht  (1.702 m)   Wt 213 lb (96.6 kg)   SpO2  100%   BMI 33.36 kg/m   Physical Exam  Constitutional: She is oriented to person, place, and time. She appears well-developed. No distress.  Cardiovascular: Normal rate and regular rhythm.   No murmur heard. Pulmonary/Chest: Effort normal and breath sounds normal. She has no wheezes. She has no rales.  Abdominal: Soft.  Nondistended. No tenderness on exam.  Genitourinary:  Genitourinary Comments: Pelvic Exam: External: normal female genitalia without lesions or masses Vagina: Laxity noted. ? Cystocele.    Neurological: She is alert and oriented to person, place, and time.  Psychiatric:  Flat affect. Anxious and perseverative.  Vitals reviewed.  UC Treatments / Results  Labs (all labs ordered are listed, but only abnormal results are displayed) Labs Reviewed  URINALYSIS, COMPLETE (UACMP) WITH MICROSCOPIC - Abnormal; Notable for the following:       Result Value   Color, Urine STRAW (*)    Squamous Epithelial / LPF 0-5 (*)    Bacteria, UA RARE (*)    All other components within normal limits    EKG  EKG Interpretation None      Radiology No results found.  Procedures Procedures (including critical care time)  Medications Ordered in UC Medications - No data to display   Initial Impression / Assessment and Plan / UC Course  I have reviewed the triage vital signs and the nursing notes.  Pertinent labs & imaging results that were available during my care of the patient were reviewed by me and considered in my medical decision making (see chart for details).   51 year old female presents with abdominal pain. She also reports something protruding from her vagina. Her abdominal exam is unremarkable. She has had recent laboratory studies which were reviewed today. Unremarkable. Urine negative. Given the duration of her abdominal pain, I advised her to follow-up with her PCP. No indication for CT or further workup at this time. Regarding her vaginal complaint, she may have  a cystocele based on her history and laxity. Advised her to see GYN or urology.  Final Clinical Impressions(s) / UC Diagnoses   Final diagnoses:  Lower abdominal pain   New Prescriptions New Prescriptions   No medications on file    Controlled Substance Prescriptions [place] Controlled Substance Registry consulted? N/A   Tommie Sams, DO 07/31/17 1258

## 2017-08-01 ENCOUNTER — Ambulatory Visit
Admission: RE | Admit: 2017-08-01 | Discharge: 2017-08-01 | Disposition: A | Discharge status: Home / Self Care | Payer: Medicaid Other | Source: Ambulatory Visit | Attending: Medical Oncology | Admitting: Medical Oncology

## 2017-08-01 DIAGNOSIS — R928 Other abnormal and inconclusive findings on diagnostic imaging of breast: Secondary | ICD-10-CM | POA: Insufficient documentation

## 2017-08-01 DIAGNOSIS — Z1239 Encounter for other screening for malignant neoplasm of breast: Secondary | ICD-10-CM

## 2017-08-01 DIAGNOSIS — Z1231 Encounter for screening mammogram for malignant neoplasm of breast: Secondary | ICD-10-CM | POA: Diagnosis present

## 2017-08-06 ENCOUNTER — Other Ambulatory Visit: Payer: Self-pay | Admitting: Medical Oncology

## 2017-08-06 DIAGNOSIS — R928 Other abnormal and inconclusive findings on diagnostic imaging of breast: Secondary | ICD-10-CM

## 2017-08-06 DIAGNOSIS — N6320 Unspecified lump in the left breast, unspecified quadrant: Secondary | ICD-10-CM

## 2017-08-27 ENCOUNTER — Other Ambulatory Visit: Payer: Medicaid Other

## 2017-08-27 ENCOUNTER — Ambulatory Visit: Payer: Medicaid Other

## 2017-09-03 ENCOUNTER — Ambulatory Visit
Admission: RE | Admit: 2017-09-03 | Discharge: 2017-09-03 | Disposition: A | Discharge status: Home / Self Care | Payer: Medicaid Other | Source: Ambulatory Visit | Attending: Medical Oncology | Admitting: Medical Oncology

## 2017-09-03 DIAGNOSIS — N6002 Solitary cyst of left breast: Secondary | ICD-10-CM | POA: Insufficient documentation

## 2017-09-03 DIAGNOSIS — R928 Other abnormal and inconclusive findings on diagnostic imaging of breast: Secondary | ICD-10-CM | POA: Diagnosis present

## 2017-09-03 DIAGNOSIS — N6320 Unspecified lump in the left breast, unspecified quadrant: Secondary | ICD-10-CM

## 2018-08-02 ENCOUNTER — Other Ambulatory Visit: Payer: Self-pay | Admitting: Medical Oncology

## 2018-08-02 DIAGNOSIS — Z1231 Encounter for screening mammogram for malignant neoplasm of breast: Secondary | ICD-10-CM

## 2018-08-28 ENCOUNTER — Inpatient Hospital Stay: Admission: RE | Admit: 2018-08-28 | Payer: Medicaid Other | Source: Ambulatory Visit

## 2018-09-18 ENCOUNTER — Ambulatory Visit
Admission: RE | Admit: 2018-09-18 | Discharge: 2018-09-18 | Disposition: A | Discharge status: Home / Self Care | Payer: Medicaid Other | Source: Ambulatory Visit | Attending: Medical Oncology | Admitting: Medical Oncology

## 2018-09-18 ENCOUNTER — Encounter (INDEPENDENT_AMBULATORY_CARE_PROVIDER_SITE_OTHER): Payer: Self-pay

## 2018-09-18 DIAGNOSIS — Z1231 Encounter for screening mammogram for malignant neoplasm of breast: Secondary | ICD-10-CM

## 2019-08-06 ENCOUNTER — Other Ambulatory Visit: Payer: Self-pay | Admitting: Medical Oncology

## 2019-11-27 ENCOUNTER — Other Ambulatory Visit: Payer: Self-pay

## 2019-11-27 ENCOUNTER — Encounter: Payer: Self-pay | Admitting: Emergency Medicine

## 2019-11-27 ENCOUNTER — Ambulatory Visit
Admission: EM | Admit: 2019-11-27 | Discharge: 2019-11-27 | Disposition: A | Discharge status: Home / Self Care | Payer: Medicaid Other | Attending: Family Medicine | Admitting: Family Medicine

## 2019-11-27 DIAGNOSIS — H8110 Benign paroxysmal vertigo, unspecified ear: Secondary | ICD-10-CM

## 2019-11-27 MED ORDER — MECLIZINE HCL 25 MG PO TABS: 25.0000 mg | ORAL_TABLET | Freq: Three times a day (TID) | ORAL | 0 refills | Status: DC | PRN

## 2019-11-27 NOTE — ED Provider Notes (Signed)
[facility]    CSN: [id number] Arrival date & time: 11/27/19  1533   History   Chief Complaint Chief Complaint  Patient presents with  . Dizziness  . Nausea  . Emesis   HPI   54 year old Andrea [name] presents with the above complaints.  Patient reports a 2-day history of intermittent dizziness.  Described as feeling unsteady on her feet and as if the room is spinning.  Associated nausea.  One episode of emesis.  Last briefly and then recurs later on.  Has taken Tylenol without resolution.  Patient also notes recent chest tightness.  No shortness of breath.  No other associated symptoms.  No relieving factors.  No other complaints at this time.  Past Medical History:  Diagnosis Date  . Diabetes mellitus without complication (HCC)   . DVT (deep venous thrombosis) (HCC)   . Fibromyalgia   . Head ache   . Prediabetes   . Schizophrenia (HCC)   . Thyroid disease    Past Surgical History:  Procedure Laterality Date  . BREAST CYST ASPIRATION Left    long long time ago   OB History   No obstetric history on file.    Home Medications    Prior to Admission medications   Medication Sig Start Date End Date Taking? Authorizing Provider  acetaminophen (TYLENOL) 500 MG tablet Take 500 mg by mouth every 6 (six) hours as needed.   Yes [provider]  aspirin EC 81 MG tablet Take 81 mg by mouth daily.   Yes [provider]  Atorvastatin Calcium (LIPITOR PO) Take by mouth.   Yes [provider]  gabapentin (NEURONTIN) 100 MG capsule Take 100 mg by mouth 3 (three) times daily.   Yes [provider]  levothyroxine (SYNTHROID, LEVOTHROID) 137 MCG tablet Take 137 mcg by mouth daily before breakfast.   Yes [provider]  metFORMIN (GLUCOPHAGE) 500 MG tablet Take by mouth 2 (two) times daily with a meal.   Yes [provider]  naproxen (NAPROSYN) 500 MG tablet Take 1 tablet (500 mg total) by mouth 2 (two) times daily. Take with  food 03/04/17  Yes Ovid Curd P, PA-C  nystatin cream (MYCOSTATIN) Apply 1 application topically 2 (two) times daily.   Yes [provider]  OLANZapine zydis (ZYPREXA) 10 MG disintegrating tablet Take 10 mg by mouth at bedtime.   Yes [provider]  polyethylene glycol (MIRALAX) packet Take 17 g by mouth 2 (two) times daily. 04/25/16  Yes Isa Rankin, MD  Wheat Dextrin (BENEFIBER DRINK MIX PO) Take 1 packet by mouth daily.   Yes [provider]  meclizine (ANTIVERT) 25 MG tablet Take 1 tablet (25 mg total) by mouth 3 (three) times daily as needed for dizziness. 11/27/19   Tommie Sams, DO    Family History Family History  Problem Relation Age of Onset  . Colon cancer Mother   . Diabetes Mother   . Mental illness Mother   . Colon cancer Father   . Breast cancer Neg Hx     Social History Social History   Tobacco Use  . Smoking status: Former Smoker    Quit date: 08/01/2007    Years since quitting: 12.3  . Smokeless tobacco: Never Used  Substance Use Topics  . Alcohol use: No  . Drug use: No     Allergies   Bee venom   Review of Systems Review of Systems  Constitutional: Negative.   Respiratory: Positive  for chest tightness.   Neurological: Positive for dizziness.   Physical Exam Triage Vital Signs ED Triage Vitals [11/27/19 1547]  Enc Vitals Group     BP 111/74     Pulse Rate 74     Resp 18     Temp 98.3 F (36.8 C)     Temp Source Oral     SpO2 100 %     Weight 215 lb (97.5 kg)     Height 5' 6.5" (1.689 m)     Head Circumference      Peak Flow      Pain Score 2     Pain Loc      Pain Edu?      Excl. in GC?    Updated Vital Signs BP 111/74 (BP Location: Left Arm)   Pulse 74   Temp 98.3 F (36.8 C) (Oral)   Resp 18   Ht 5' 6.5" (1.689 m)   Wt 97.5 kg   SpO2 100%   BMI 34.18 kg/m   Visual Acuity Right Eye Distance:   Left Eye Distance:   Bilateral Distance:    Right Eye Near:   Left Eye Near:      Bilateral Near:     Physical Exam Vitals and nursing note reviewed.  Constitutional:      General: She is not in acute distress.    Appearance: Normal appearance. She is obese. She is not ill-appearing.  HENT:     Head: Normocephalic and atraumatic.  Eyes:     General:        Right eye: No discharge.        Left eye: No discharge.     Extraocular Movements: Extraocular movements intact.     Conjunctiva/sclera: Conjunctivae normal.     Pupils: Pupils are equal, round, and reactive to light.  Cardiovascular:     Rate and Rhythm: Normal rate and regular rhythm.     Heart sounds: No murmur.  Pulmonary:     Effort: Pulmonary effort is normal.     Breath sounds: Normal breath sounds. No wheezing or rales.  Neurological:     General: No focal deficit present.     Mental Status: She is alert.  Psychiatric:        Behavior: Behavior normal.     Comments: Flat affect.    UC Treatments / Results  Labs (all labs ordered are listed, but only abnormal results are displayed) Labs Reviewed - No data to display  EKG   Radiology No results found.  Procedures Procedures (including critical care time)  Medications Ordered in UC Medications - No data to display  Initial Impression / Assessment and Plan / UC Course  I have reviewed the triage vital signs and the nursing notes.  Pertinent labs & imaging results that were available during my care of the patient were reviewed by me and considered in my medical decision making (see chart for details).    54 year old Andrea [name] presents with BPPV.  Meclizine as directed.  Advised Epley maneuver at home.  Supportive care.  Final Clinical Impressions(s) / UC Diagnoses   Final diagnoses:  Benign paroxysmal positional vertigo, unspecified laterality     Discharge Instructions     Medication as prescribed.  Try epley maneuver at home.  Take care  Dr. Adriana Simas    ED Prescriptions    Medication Sig Dispense Auth. Provider    meclizine (ANTIVERT) 25 MG tablet Take 1 tablet (25 mg total) by mouth 3 (  three) times daily as needed for dizziness. 30 tablet Coral Spikes, DO     PDMP not reviewed this encounter.   Coral Spikes, [place] 11/27/19 1622

## 2019-11-27 NOTE — Discharge Instructions (Signed)
Medication as prescribed.  Try epley maneuver at home.  Take care  Dr. Adriana Simas

## 2019-11-27 NOTE — ED Triage Notes (Addendum)
Patient in today c/o dizziness, nausea and emesis off & on x 2 days. Patient states she feels like the room is spinning. Patient took Tylenol this morning.

## 2020-01-22 ENCOUNTER — Other Ambulatory Visit: Payer: Self-pay | Admitting: Medical Oncology

## 2020-01-22 DIAGNOSIS — Z1231 Encounter for screening mammogram for malignant neoplasm of breast: Secondary | ICD-10-CM

## 2020-01-27 ENCOUNTER — Other Ambulatory Visit: Payer: Self-pay | Admitting: Medical Oncology

## 2020-01-27 DIAGNOSIS — N6001 Solitary cyst of right breast: Secondary | ICD-10-CM

## 2020-02-18 ENCOUNTER — Ambulatory Visit
Admission: RE | Admit: 2020-02-18 | Discharge: 2020-02-18 | Disposition: A | Discharge status: Home / Self Care | Payer: Medicaid Other | Source: Ambulatory Visit | Attending: Medical Oncology | Admitting: Medical Oncology

## 2020-02-18 DIAGNOSIS — N6001 Solitary cyst of right breast: Secondary | ICD-10-CM

## 2020-03-16 ENCOUNTER — Encounter: Payer: Self-pay | Admitting: Emergency Medicine

## 2020-03-16 ENCOUNTER — Ambulatory Visit
Admission: EM | Admit: 2020-03-16 | Discharge: 2020-03-16 | Disposition: A | Discharge status: Home / Self Care | Payer: Medicaid Other | Attending: Urgent Care | Admitting: Urgent Care

## 2020-03-16 ENCOUNTER — Other Ambulatory Visit: Payer: Self-pay

## 2020-03-16 DIAGNOSIS — N611 Abscess of the breast and nipple: Secondary | ICD-10-CM

## 2020-03-16 MED ORDER — DOXYCYCLINE HYCLATE 100 MG PO CAPS: 100.0000 mg | ORAL_CAPSULE | Freq: Two times a day (BID) | ORAL | 0 refills | Status: AC

## 2020-03-16 NOTE — ED Triage Notes (Signed)
Pt has an abscess on her right breast. She states when it started a couple months ago it had a black head in the area. Then about 2-3 days ago it started getting red and painful. She has been using Neosporin.

## 2020-03-16 NOTE — Discharge Instructions (Addendum)
It was very nice seeing you today in clinic. Thank you for entrusting me with your care.   Keep area clean and dry. Apply warm compresses to help with pain and inflammation. Continue topical mupirocin. Adding oral doxycycline.  Make arrangements to follow up with your regular doctor in 1 week for re-evaluation if not improving.  If your symptoms/condition worsens, please seek follow up care either here or in the ER. Please remember, our [facility] providers are "right here with you" when you need [place].   Again, it was my pleasure to take care of you today. Thank you for choosing our clinic. I hope that you start to feel better quickly.   Quentin Mulling, MSN, APRN, FNP-C, CEN Advanced Practice Provider [facility] [place] [facility]

## 2020-03-17 NOTE — ED Provider Notes (Signed)
[place]   Name: Andrea [name] DOB: Sep 17, 1966 MRN: [id number] CSN: [id number] PCP: [facility]  Arrival date and time:  03/16/20 1558  Chief Complaint:  Abscess  NOTE: Prior to seeing the patient today, I have reviewed the triage nursing documentation and vital signs. Clinical staff has updated patient's PMH/PSHx, current medication list, and drug allergies/intolerances to ensure comprehensive history available to assist in medical decision making.   History:   HPI: Andrea [name] is a 54 y.o. female who presents today with complaints of a "painful bump" on her RIGHT breast. Patient reporting that area started as a "lump" about three months ago. She was seen by her PCP and noted that it was a comedone and advised to leave it alone. Over the last 4-5 days, patient notes that area has become erythematous and painful. She has been attempting to manually express the content by squeezing the area; no results. No drainage or fevers reported. In efforts to conservatively manage her symptoms at home, the patient notes that she has used topical mupirocin, which has not really helped to improve her symptoms.   Past Medical History:  Diagnosis Date  . Diabetes mellitus without complication (HCC)   . DVT (deep venous thrombosis) (HCC)   . Fibromyalgia   . Head ache   . Prediabetes   . Schizophrenia (HCC)   . Thyroid disease     Past Surgical History:  Procedure Laterality Date  . BREAST CYST ASPIRATION Left    long long time ago    Family History  Problem Relation Age of Onset  . Colon cancer Mother   . Diabetes Mother   . Mental illness Mother   . Colon cancer Father   . Breast cancer Neg Hx     Social History   Tobacco Use  . Smoking status: Former Smoker    Quit date: 08/01/2007    Years since quitting: 12.6  . Smokeless tobacco: Never Used  Substance Use Topics  . Alcohol use: No  . Drug use: No    There are no problems to display for this  patient.   Home Medications:    Current Meds  Medication Sig  . acetaminophen (TYLENOL) 500 MG tablet Take 500 mg by mouth every 6 (six) hours as needed.  Marland Kitchen aspirin EC 81 MG tablet Take 81 mg by mouth daily.  . Atorvastatin Calcium (LIPITOR PO) Take by mouth.  . gabapentin (NEURONTIN) 100 MG capsule Take 100 mg by mouth 3 (three) times daily.  Marland Kitchen levothyroxine (SYNTHROID, LEVOTHROID) 137 MCG tablet Take 137 mcg by mouth daily before breakfast.  . metFORMIN (GLUCOPHAGE) 500 MG tablet Take by mouth 2 (two) times daily with a meal.  . OLANZapine zydis (ZYPREXA) 10 MG disintegrating tablet Take 10 mg by mouth at bedtime.  . polyethylene glycol (MIRALAX) packet Take 17 g by mouth 2 (two) times daily.  . Wheat Dextrin (BENEFIBER DRINK MIX PO) Take 1 packet by mouth daily.    Allergies:   Bee venom  Review of Systems (ROS):  Review of systems NEGATIVE unless otherwise noted in narrative H&P section.   Vital Signs: Today's Vitals   03/16/20 1636 03/16/20 1639 03/16/20 1709  BP:  117/76   Pulse:  69   Resp:  18   Temp:  98.4 F (36.9 C)   TempSrc:  Oral   SpO2:  98%   Weight: 214 lb 15.2 oz (97.5 kg)    Height: 5\' 6"  (1.676 m)  PainSc: 5   5     Physical Exam: Physical Exam  Constitutional: She is oriented to person, place, and time and well-developed, well-nourished, and in no distress.  HENT:  Head: Normocephalic and atraumatic.  Eyes: Pupils are equal, round, and reactive to light.  Cardiovascular: Normal rate and intact distal pulses.  Pulmonary/Chest: Effort normal. No respiratory distress.  Neurological: She is alert and oriented to person, place, and time. Gait normal.  Skin: Skin is warm and dry. Lesion noted. No rash noted. She is not diaphoretic. There is erythema.     Psychiatric: Memory, affect and judgment normal. Her mood appears anxious.  Nursing note and vitals reviewed.   Urgent Care Treatments / Results:   No orders of the defined types were placed  in this encounter.   LABS: PLEASE NOTE: all labs that were ordered this encounter are listed, however only abnormal results are displayed. Labs Reviewed - No data to display  EKG: -None  RADIOLOGY: No results found.  PROCEDURES: Procedures  MEDICATIONS RECEIVED THIS VISIT: Medications - No data to display  PERTINENT CLINICAL COURSE NOTES/UPDATES:   Initial Impression / Assessment and Plan / Urgent Care Course:  Pertinent labs & imaging results that were available during my care of the patient were personally reviewed by me and considered in my medical decision making (see lab/imaging section of note for values and interpretations).  Andrea [name] is a 54 y.o. female who presents to [facility] today with complaints of Abscess  Patient is well appearing overall in clinic today. She does not appear to be in any acute distress. Presenting symptoms (see HPI) and exam as documented above. Exam consistent with 2 cm abscess of the RIGHT breast. Likely secondary to infected comedone. No fluctuance; felt to not be amenable to I&D. Patient wishes to avoid I&D and puncture attempts if possible anyway. Treating with a 7 day course of oral doxycycline and topical mupirocin). Encouraged warm compresses to help with pain and swelling. Patient to monitor for signs and symptoms of infection, which would include increased redness, swelling, streaking, drainage, pain, and the development of a fever. May use Tylenol and/or Ibuprofen as needed for pain.    Discussed follow up with primary care physician in 1 week for re-evaluation. I have reviewed the follow up and strict return precautions for any new or worsening symptoms. Patient is aware of symptoms that would be deemed urgent/emergent, and would thus require further evaluation either here or in the emergency department. At the time of discharge, she verbalized understanding and consent with the discharge plan as it was reviewed with her. All  questions were fielded by provider and/or clinic staff prior to patient discharge.    Final Clinical Impressions / Urgent Care Diagnoses:   Final diagnoses:  Breast abscess    New Prescriptions:  [place] Controlled Substance Registry consulted? Not Applicable  Meds ordered this encounter  Medications  . doxycycline (VIBRAMYCIN) 100 MG capsule    Sig: Take 1 capsule (100 mg total) by mouth 2 (two) times daily for 7 days.    Dispense:  14 capsule    Refill:  0    Recommended Follow up Care:  Patient encouraged to follow up with the following provider within the specified time frame, or sooner as dictated by the severity of her symptoms. As always, she was instructed that for any urgent/emergent care needs, she should seek care either here or in the emergency department for more immediate evaluation.  Follow-up Information  [place] In 1 week.   Why: General reassessment of symptoms if not improving Contact information: [address] [place] [address] [phone]         NOTE: This note was prepared using Dragon dictation software along with smaller phrase technology. Despite my best ability to proofread, there is the potential that transcriptional errors may still occur from this process, and are completely unintentional.    Verlee Monte, NP 03/17/20 1820

## 2020-03-25 ENCOUNTER — Other Ambulatory Visit: Payer: Self-pay

## 2020-03-25 ENCOUNTER — Encounter: Payer: Self-pay | Admitting: Surgery

## 2020-03-25 ENCOUNTER — Ambulatory Visit (INDEPENDENT_AMBULATORY_CARE_PROVIDER_SITE_OTHER): Payer: Medicaid Other | Admitting: Surgery

## 2020-03-25 VITALS — BP 119/76 | HR 79 | Temp 98.6°F | Ht 67.0 in | Wt 207.0 lb

## 2020-03-25 DIAGNOSIS — L723 Sebaceous cyst: Secondary | ICD-10-CM

## 2020-03-25 DIAGNOSIS — L089 Local infection of the skin and subcutaneous tissue, unspecified: Secondary | ICD-10-CM

## 2020-03-25 NOTE — Progress Notes (Signed)
Patient ID: Andrea [name], female   DOB: 22-Feb-1966, 54 y.o.   MRN: [id number]  Chief Complaint: Infected dermal cyst right breast.  History of Present Illness Andrea [name] is a 54 y.o. female with a 2+ week history of inflammatory mass of the upper inner quadrant of the patient's right breast area.  She has a known sebaceous cyst present for years.  She had a recent mammogram which confirmed the absence of any actual breast disease.  She denies drainage.  She has been taking doxycycline for the interim and it has improved to some degree.  Past Medical History Past Medical History:  Diagnosis Date  . Diabetes mellitus without complication ([place])   . DVT (deep venous thrombosis) ([place])   . Fibromyalgia   . Head ache   . Prediabetes   . Schizophrenia ([place])   . Thyroid disease       Past Surgical History:  Procedure Laterality Date  . BREAST CYST ASPIRATION Left    long long time ago  . SALPINGECTOMY Right     Allergies  Allergen Reactions  . Bee Venom Swelling    Current Outpatient Medications  Medication Sig Dispense Refill  . acetaminophen (TYLENOL) 500 MG tablet Take 500 mg by mouth every 6 (six) hours as needed.    Marland Kitchen aspirin EC 81 MG tablet Take 81 mg by mouth daily.    . Atorvastatin Calcium (LIPITOR PO) Take by mouth.    . doxycycline (VIBRA-TABS) 100 MG tablet Take 100 mg by mouth 2 (two) times daily.    Marland Kitchen gabapentin (NEURONTIN) 100 MG capsule Take 100 mg by mouth 3 (three) times daily.    Marland Kitchen levothyroxine (SYNTHROID, LEVOTHROID) 137 MCG tablet Take 137 mcg by mouth daily before breakfast.    . metFORMIN (GLUCOPHAGE) 500 MG tablet Take by mouth 2 (two) times daily with a meal.    . nystatin cream (MYCOSTATIN) Apply 1 application topically 2 (two) times daily.    Marland Kitchen OLANZapine zydis (ZYPREXA) 10 MG disintegrating tablet Take 10 mg by mouth at bedtime.    . polyethylene glycol (MIRALAX) packet Take 17 g by mouth 2 (two) times daily. 100 each 0  . Wheat Dextrin (BENEFIBER  DRINK MIX PO) Take 1 packet by mouth daily.     No current facility-administered medications for this visit.    Family History Family History  Problem Relation Age of Onset  . Colon cancer Mother   . Diabetes Mother   . Mental illness Mother   . Colon cancer Father   . Breast cancer Neg Hx       Social History Social History   Tobacco Use  . Smoking status: Former Smoker    Quit date: 08/01/2007    Years since quitting: 12.6  . Smokeless tobacco: Never Used  Vaping Use  . Vaping Use: Never used  Substance Use Topics  . Alcohol use: No  . Drug use: No        Review of Systems  Constitutional: Positive for diaphoresis and weight loss.  HENT: Negative.   Eyes: Negative.   Respiratory: Negative.   Cardiovascular: Negative.   Gastrointestinal: Negative.   Genitourinary: Negative.   Skin: Negative for itching and rash.  Neurological: Positive for headaches.  Endo/Heme/Allergies: Negative.   Psychiatric/Behavioral: Negative for depression, substance abuse and suicidal ideas.    Physical Exam Blood pressure 119/76, pulse 79, temperature 98.6 F (37 C), height 5\' 7"  (1.702 m), weight 207 lb (93.9 kg), SpO2 95 %. Last  Weight  Most recent update: 03/25/2020  2:18 PM   Weight  93.9 kg (207 lb)            CONSTITUTIONAL: Well developed, and nourished, appropriately responsive and aware without distress.   EYES: Sclera non-icteric.   EARS, NOSE, MOUTH AND THROAT: Mask worn.    Hearing is intact to voice.  NECK: Trachea is midline, and there is no jugular venous distension.  LYMPH NODES:  Lymph nodes in the neck are not enlarged. RESPIRATORY:  Lungs are clear, and breath sounds are equal bilaterally. Normal respiratory effort without pathologic use of accessory muscles. CARDIOVASCULAR: Heart is regular in rate and rhythm. GI: The abdomen is  soft, nontender, and nondistended.  GU: Upper medial aspect of the right breast there is a 2-1/2 cm fluctuant area consistent  with a infected cyst with a small punctum centrally.  There is no drainage there is minimal surrounding induration..  Erythema seems to have been improving, per report.   MUSCULOSKELETAL:  Symmetrical muscle tone appreciated in all four extremities.    SKIN: Skin turgor is normal. No pathologic skin lesions appreciated.  NEUROLOGIC:  Motor and sensation appear grossly normal.  Cranial nerves are grossly without defect. PSYCH:  Alert and oriented to person, place and time. Affect is appropriate for situation.  Data Reviewed I have personally reviewed what is currently available of the patient's imaging, recent labs and medical records.   Labs:   CMP Latest Ref Rng & Units 12/28/2015 02/24/2015  Glucose 65 - 99 mg/dL 202(R) 93  BUN 6 - 20 mg/dL 14 15  Creatinine 4.27 - 1.00 mg/dL 0.62 3.76  Sodium 283 - 145 mmol/L 136 140  Potassium 3.5 - 5.1 mmol/L 3.9 4.0  Chloride 101 - 111 mmol/L 104 106  CO2 22 - 32 mmol/L 22 22  Calcium 8.9 - 10.3 mg/dL 9.2 9.1  Total Protein 6.5 - 8.1 g/dL 8.2(H) -  Total Bilirubin 0.3 - 1.2 mg/dL 0.8 -  Alkaline Phos 38 - 126 U/L 83 -  AST 15 - 41 U/L 27 -  ALT 14 - 54 U/L 28 -      Imaging: Recent mammography report reviewed. Within last 24 hrs: No results found.  Assessment    Dermal cystic abscess medial right breast. There are no problems to display for this patient.   Plan    Incision and drainage.  Discussed with patient plan for local anesthesia with incision of the fluctuant area with drainage and packing. Informed consent was obtained with family member present assisting in the process.  Patient was transferred to room 9 where her medial right breast was prepped, I injected local anesthesia 1% lidocaine with epinephrine and made a transverse incision along the skin lines within this fluctuant mass.  Immediate egress of drainage seropurulent and cystic contents was removed.  We then cauterized with a bone curette to explore the cavity to her  tolerance.  I then packed the incision with quarter inch iodoform packing strip and gave her instructions to keep this wound open on a twice daily basis as she showers.  She said to cover it with a dry dressing. She will continue her antibiotics and follow-up with [place] in 1 to 2 weeks.  Face-to-face time spent with the patient and accompanying care providers(if present) was 20 minutes, with more than 50% of the time spent counseling, educating, and coordinating care of the patient.      Campbell Lerner M.D., FACS 03/25/2020, 2:42 PM

## 2020-03-25 NOTE — Patient Instructions (Addendum)
We have removed a Cyst in our office today.  You may shower tomorrow evening. Take off the dressing and pull out the packing before showering. While showering let the warm soapy water run over the area. With the water on you take a q-tip and run it around inside the wound to help to keep it open. After showering pat the area dry and apply another gauze dressing with tape. Do this 1-2 times a day.   Follow up here in about a week.   Finish all of your antibiotics.  Please see your follow-up appointment provided. We will see you back in office to make sure this area is healed.. If you have any questions or concerns prior to this appointment, call our office and speak with a nurse.    Excision of Skin Cysts or Lesions Excision of a skin lesion refers to the removal of a section of skin by making small cuts (incisions) in the skin. This procedure may be done to remove a cancerous (malignant) or noncancerous (benign) growth on the skin. It is typically done to treat or prevent cancer or infection. It may also be done to improve cosmetic appearance. The procedure may be done to remove:  Cancerous growths, such as basal cell carcinoma, squamous cell carcinoma, or melanoma.  Noncancerous growths, such as a cyst or lipoma.  Growths, such as moles or skin tags, which may be removed for cosmetic reasons.  Various excision or surgical techniques may be used depending on your condition, the location of the lesion, and your overall health. Tell a health care provider about:  Any allergies you have.  All medicines you are taking, including vitamins, herbs, eye drops, creams, and over-the-counter medicines.  Any problems you or family members have had with anesthetic medicines.  Any blood disorders you have.  Any surgeries you have had.  Any medical conditions you have.  Whether you are pregnant or may be pregnant. What are the risks? Generally, this is a safe procedure. However, problems may  occur, including:  Bleeding.  Infection.  Scarring.  Recurrence of the cyst, lipoma, or cancer.  Changes in skin sensation or appearance, such as discoloration or swelling.  Reaction to the anesthetics.  Allergic reaction to surgical materials or ointments.  Damage to nerves, blood vessels, muscles, or other structures.  Continued pain.  What happens before the procedure?  Ask your health care provider about: ? Changing or stopping your regular medicines. This is especially important if you are taking diabetes medicines or blood thinners. ? Taking medicines such as aspirin and ibuprofen. These medicines can thin your blood. Do not take these medicines before your procedure if your health care provider instructs you not to.  You may be asked to take certain medicines.  You may be asked to stop smoking.  You may have an exam or testing.  Plan to have someone take you home after the procedure.  Plan to have someone help you with activities during recovery. What happens during the procedure?  To reduce your risk of infection: ? Your health care team will wash or sanitize their hands. ? Your skin will be washed with soap.  You will be given a medicine to numb the area (local anesthetic).  One of the following excision techniques will be performed.  At the end of any of these procedures, antibiotic ointment will be applied as needed. Each of the following techniques may vary among health care providers and hospitals. Complete Surgical Excision The area of  skin that needs to be removed will be marked with a pen. Using a small scalpel or scissors, the surgeon will gently cut around and under the lesion until it is completely removed. The lesion will be placed in a fluid and sent to the lab for examination. If necessary, bleeding will be controlled with a device that delivers heat (electrocautery). The edges of the wound may be stitched (sutured) together, and a bandage  (dressing) will be applied. This procedure may be performed to treat a cancerous growth or a noncancerous cyst or lesion. Excision of a Cyst The surgeon will make an incision on the cyst. The entire cyst will be removed through the incision. The incision may be closed with sutures. Shave Excision During shave excision, the surgeon will use a small blade or an electrically heated loop instrument to shave off the lesion. This may be done to remove a mole or a skin tag. The wound will usually be left to heal on its own without sutures. Punch Excision During punch excision, the surgeon will use a small tool that is like a cookie cutter or a hole punch to cut a circle shape out of the skin. The outer edges of the skin will be sutured together. This may be done to remove a mole or a scar or to perform a biopsy of the lesion. Mohs Micrographic Surgery During Mohs micrographic surgery, layers of the lesion will be removed with a scalpel or a loop instrument and will be examined right away under a microscope. Layers will be removed until all of the abnormal or cancerous tissue has been removed. This procedure is minimally invasive, and it ensures the best cosmetic outcome. It involves the removal of as little normal tissue as possible. Mohs is usually done to treat skin cancer, such as basal cell carcinoma or squamous cell carcinoma, particularly on the face and ears. Depending on the size of the surgical wound, it may be sutured closed. What happens after the procedure?  Return to your normal activities as told by your health care provider.  Talk with your health care provider to discuss any test results, treatment options, and if necessary, the need for more tests. This information is not intended to replace advice given to you by your health care provider. Make sure you discuss any questions you have with your health care provider. Document Released: 12/27/2009 Document Revised: 03/09/2016 Document  Reviewed: 11/18/2014 Elsevier Interactive Patient Education  [employer].

## 2020-03-27 ENCOUNTER — Other Ambulatory Visit: Payer: Self-pay

## 2020-03-27 ENCOUNTER — Ambulatory Visit
Admission: EM | Admit: 2020-03-27 | Discharge: 2020-03-27 | Disposition: A | Discharge status: Home / Self Care | Payer: Medicaid Other

## 2020-03-27 ENCOUNTER — Encounter: Payer: Self-pay | Admitting: Emergency Medicine

## 2020-03-27 DIAGNOSIS — L0291 Cutaneous abscess, unspecified: Secondary | ICD-10-CM

## 2020-03-27 NOTE — ED Triage Notes (Signed)
Patient states that she had surgery to remove cyst on her right breast.  Patient currently on doxycycline.  Patient states that she is her to have her packing removed.  Patient denies fevers.

## 2020-03-27 NOTE — Discharge Instructions (Addendum)
Follow your surgeons instructions for care of the abscess after the packing is removed.  Return to your surgeon at the next appointment that is scheduled.

## 2020-03-30 ENCOUNTER — Encounter: Payer: Medicaid Other | Admitting: Surgery

## 2020-04-08 ENCOUNTER — Encounter: Payer: Self-pay | Admitting: Surgery

## 2020-04-08 ENCOUNTER — Ambulatory Visit (INDEPENDENT_AMBULATORY_CARE_PROVIDER_SITE_OTHER): Payer: Medicaid Other | Admitting: Surgery

## 2020-04-08 ENCOUNTER — Other Ambulatory Visit: Payer: Self-pay

## 2020-04-08 VITALS — BP 113/79 | HR 73 | Temp 97.7°F | Resp 12 | Ht 67.0 in | Wt 205.0 lb

## 2020-04-08 DIAGNOSIS — L089 Local infection of the skin and subcutaneous tissue, unspecified: Secondary | ICD-10-CM

## 2020-04-08 DIAGNOSIS — L723 Sebaceous cyst: Secondary | ICD-10-CM

## 2020-04-08 NOTE — Progress Notes (Signed)
[facility] POST-OP OFFICE VISIT  04/08/2020  HPI: Andrea [name] is a 54 y.o. female 2 weeks s/p I&D of right medial breast infected sebaceous cyst.  She reports no drainage, has been keeping her dressing in place.  She has no complaints of pain or tenderness.  Vital signs: BP 113/79   Pulse 73   Temp 97.7 F (36.5 C) (Oral)   Resp 12   Ht 5\' 7"  (1.702 m)   Wt 205 lb (93 kg)   SpO2 94%   BMI 32.11 kg/m    Physical Exam: Constitutional: She appears well, quite pleased with her progress  Skin: Right medial breast there is a tiny crust covering remainder of the healed wound.  There is no surrounding induration or erythema.  There is no evidence of drainage.  I told her she no longer needed to keep her dressing applied.  Assessment/Plan: This is a 54 y.o. female 2 weeks s/p I&D right breast cyst  There are no problems to display for this patient.   -Follow-up as needed.   45 M.D., FACS 04/08/2020, 11:25 AM

## 2020-04-08 NOTE — Patient Instructions (Addendum)
No need for any more dressings.  Follow-up with our office as needed.  Please call and ask to speak with a nurse if you develop questions or concerns.

## 2021-02-01 ENCOUNTER — Other Ambulatory Visit: Payer: Self-pay | Admitting: Family Medicine

## 2021-02-01 DIAGNOSIS — Z1231 Encounter for screening mammogram for malignant neoplasm of breast: Secondary | ICD-10-CM

## 2021-02-08 ENCOUNTER — Other Ambulatory Visit: Payer: Self-pay | Admitting: Family Medicine

## 2021-02-08 DIAGNOSIS — N6313 Unspecified lump in the right breast, lower outer quadrant: Secondary | ICD-10-CM

## 2021-02-10 ENCOUNTER — Other Ambulatory Visit: Payer: Self-pay | Admitting: Family Medicine

## 2021-02-10 DIAGNOSIS — N6313 Unspecified lump in the right breast, lower outer quadrant: Secondary | ICD-10-CM

## 2021-02-14 ENCOUNTER — Ambulatory Visit
Admission: RE | Admit: 2021-02-14 | Discharge: 2021-02-14 | Disposition: A | Discharge status: Home / Self Care | Payer: Medicaid Other | Source: Ambulatory Visit | Attending: Family Medicine | Admitting: Family Medicine

## 2021-02-14 ENCOUNTER — Other Ambulatory Visit: Payer: Self-pay

## 2021-02-14 DIAGNOSIS — N6313 Unspecified lump in the right breast, lower outer quadrant: Secondary | ICD-10-CM

## 2021-02-16 ENCOUNTER — Other Ambulatory Visit: Payer: Medicaid Other

## 2021-03-05 ENCOUNTER — Other Ambulatory Visit: Payer: Self-pay | Admitting: Family Medicine

## 2021-03-05 ENCOUNTER — Ambulatory Visit
Admission: RE | Admit: 2021-03-05 | Discharge: 2021-03-05 | Disposition: A | Discharge status: Home / Self Care | Payer: Medicaid Other | Source: Ambulatory Visit | Attending: Family Medicine | Admitting: Family Medicine

## 2021-03-05 ENCOUNTER — Other Ambulatory Visit: Payer: Self-pay

## 2021-03-05 DIAGNOSIS — R079 Chest pain, unspecified: Secondary | ICD-10-CM | POA: Diagnosis not present

## 2021-06-27 ENCOUNTER — Ambulatory Visit
Admission: EM | Admit: 2021-06-27 | Discharge: 2021-06-27 | Disposition: A | Discharge status: Home / Self Care | Payer: Medicaid Other | Attending: Physician Assistant | Admitting: Physician Assistant

## 2021-06-27 ENCOUNTER — Other Ambulatory Visit: Payer: Self-pay

## 2021-06-27 DIAGNOSIS — H00022 Hordeolum internum right lower eyelid: Secondary | ICD-10-CM

## 2021-06-27 MED ORDER — ERYTHROMYCIN 5 MG/GM OP OINT: TOPICAL_OINTMENT | OPHTHALMIC | 0 refills | Status: AC

## 2021-06-27 NOTE — Discharge Instructions (Addendum)
-  You have a stye -I have sent in an antibiotic ointment.  Apply this 4 times a day as directed. -Also use warm compresses every couple of hours.  You can take Tylenol for pain as needed. -You should be seen again if the area of swelling or redness enlarges or becomes more painful or you develop a fever or pain of the actual eye.

## 2021-06-27 NOTE — ED Provider Notes (Signed)
[facility]    CSN: [id number] Arrival date & time: 06/27/21  5638      History   Chief Complaint Chief Complaint  Patient presents with   Eye Problem    right    HPI Andrea [name] is a 55 y.o. female presenting for redness and swelling of the right lower eyelid for the past 3 days.  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  She was unsure if she could have MRSA infection traveling to her eye.  Denies any nasal source.  She denies any vision changes and the actual eye does not hurt.  No drainage from the eye, headaches or dizziness or fevers reported.  No facial swelling noted.  No other complaints or concerns.  HPI  Past Medical History:  Diagnosis Date   Diabetes mellitus without complication ([facility])    DVT (deep venous thrombosis) ([facility])    Fibromyalgia    Head ache    Prediabetes    Schizophrenia ([facility])    Thyroid disease     There are no problems to display for this patient.   Past Surgical History:  Procedure Laterality Date   BREAST CYST ASPIRATION Left    long long time ago   SALPINGECTOMY Right     OB History     Gravida  2   Para  1   Term      Preterm      AB      Living  1      SAB      IAB      Ectopic      Multiple      Live Births           Obstetric Comments  Menstrual age: 12  Age 1st Pregnancy: 43           Home Medications    Prior to Admission medications   Medication Sig Start Date End Date Taking? Authorizing Provider  acetaminophen (TYLENOL) 500 MG tablet Take 500 mg by mouth every 6 (six) hours as needed.   Yes [provider]  aspirin EC 81 MG tablet Take 81 mg by mouth daily.   Yes [provider]  Atorvastatin Calcium (LIPITOR PO) Take by mouth.   Yes [provider]  erythromycin ophthalmic ointment Place a 1/2 inch ribbon of ointment into the lower eyelid q6 h 06/27/21 07/04/21 Yes Eusebio Friendly B,  PA-C  gabapentin (NEURONTIN) 100 MG capsule Take 100 mg by mouth 3 (three) times daily.   Yes [provider]  levothyroxine (SYNTHROID, LEVOTHROID) 137 MCG tablet Take 137 mcg by mouth daily before breakfast.   Yes [provider]  metFORMIN (GLUCOPHAGE) 500 MG tablet Take by mouth 2 (two) times daily with a meal.   Yes [provider]  nystatin cream (MYCOSTATIN) Apply 1 application topically 2 (two) times daily.   Yes [provider]  OLANZapine zydis (ZYPREXA) 10 MG disintegrating tablet Take 10 mg by mouth at bedtime.   Yes [provider]  polyethylene glycol (MIRALAX) packet Take 17 g by mouth 2 (two) times daily. 04/25/16  Yes Isa Rankin, MD  Wheat Dextrin (BENEFIBER DRINK MIX PO) Take 1 packet by mouth daily.   Yes [provider]    Family History Family History  Problem Relation Age of Onset   Colon cancer Mother    Diabetes Mother  Mental illness Mother    Colon cancer Father    Breast cancer Neg Hx     Social History Social History   Tobacco Use   Smoking status: Former    Types: Cigarettes    Quit date: 08/01/2007    Years since quitting: 13.9   Smokeless tobacco: Never  Vaping Use   Vaping Use: Never used  Substance Use Topics   Alcohol use: No   Drug use: No     Allergies   Bee venom   Review of Systems Review of Systems  Constitutional:  Negative for fatigue and fever.  HENT:  Negative for congestion.   Eyes:  Positive for pain and redness. Negative for photophobia, discharge, itching and visual disturbance.  Respiratory:  Negative for cough.   Neurological:  Negative for dizziness and headaches.    Physical Exam Triage Vital Signs ED Triage Vitals  Enc Vitals Group     BP 06/27/21 0852 125/83     Pulse Rate 06/27/21 0852 65     Resp 06/27/21 0852 18     Temp 06/27/21 0852 99 F (37.2 C)     Temp Source 06/27/21 0852 Oral     SpO2 06/27/21 0852 96 %     Weight 06/27/21 0852  212 lb (96.2 kg)     Height 06/27/21 0852 5\' 7"  (1.702 m)     Head Circumference --      Peak Flow --      Pain Score 06/27/21 0851 0     Pain Loc --      Pain Edu? --      Excl. in GC? --    No data found.  Updated Vital Signs BP 125/83 (BP Location: Left Arm)   Pulse 65   Temp 99 F (37.2 C) (Oral)   Resp 18   Ht 5\' 7"  (1.702 m)   Wt 212 lb (96.2 kg)   SpO2 96%   BMI 33.20 kg/m      Physical Exam Vitals and nursing note reviewed.  Constitutional:      General: She is not in acute distress.    Appearance: Normal appearance. She is not ill-appearing or toxic-appearing.  HENT:     Head: Normocephalic and atraumatic.     Nose: Nose normal.     Mouth/Throat:     Mouth: Mucous membranes are moist.     Pharynx: Oropharynx is clear.  Eyes:     General: No scleral icterus.       Right eye: Hordeolum (small stye noted right corner lower eyelid. TTP) present. No discharge.        Left eye: No discharge.     Conjunctiva/sclera: Conjunctivae normal.  Cardiovascular:     Rate and Rhythm: Normal rate and regular rhythm.  Pulmonary:     Effort: Pulmonary effort is normal. No respiratory distress.  Musculoskeletal:     Cervical back: Neck supple.  Skin:    General: Skin is dry.  Neurological:     General: No focal deficit present.     Mental Status: She is alert. Mental status is at baseline.     Motor: No weakness.     Gait: Gait normal.  Psychiatric:        Mood and Affect: Mood normal.        Behavior: Behavior normal.        Thought Content: Thought content normal.     UC Treatments / Results  Labs (all labs ordered are listed,  but only abnormal results are displayed) Labs Reviewed - No data to display  EKG   Radiology No results found.  Procedures Procedures (including critical care time)  Medications Ordered in UC Medications - No data to display  Initial Impression / Assessment and Plan / UC Course  I have reviewed the triage vital signs and the  nursing notes.  Pertinent labs & imaging results that were available during my care of the patient were reviewed by me and considered in my medical decision making (see chart for details).  55 year old female presenting for stye of the right lower eyelid x3 days.  We will send in erythromycin ophthalmic ointment and reviewed supportive care guidelines including application of warm compresses and return and ED precautions.  Final Clinical Impressions(s) / UC Diagnoses   Final diagnoses:  Hordeolum internum of right lower eyelid     Discharge Instructions      -You have a stye -I have sent in an antibiotic ointment.  Apply this 4 times a day as directed. -Also use warm compresses every couple of hours.  You can take Tylenol for pain as needed. -You should be seen again if the area of swelling or redness enlarges or becomes more painful or you develop a fever or pain of the actual eye.     ED Prescriptions     Medication Sig Dispense Auth. Provider   erythromycin ophthalmic ointment Place a 1/2 inch ribbon of ointment into the lower eyelid q6 h 3.5 g Shirlee Latch, PA-C      PDMP not reviewed this encounter.   Shirlee Latch, PA-C 06/27/21 [phone]

## 2021-06-27 NOTE — ED Triage Notes (Signed)
Pt here with C/O right eye swelling and redness for 3 days, pt put neosporin in her nose this morning thinking it would help, pt states it is swollen right below her eye as well. It is painful in outer corner with touched or blinks.

## 2021-08-15 ENCOUNTER — Other Ambulatory Visit: Payer: Self-pay

## 2021-08-15 ENCOUNTER — Emergency Department
Admission: EM | Admit: 2021-08-15 | Discharge: 2021-08-15 | Disposition: A | Discharge status: Home / Self Care | Payer: Medicaid Other | Attending: Emergency Medicine | Admitting: Emergency Medicine

## 2021-08-15 DIAGNOSIS — E119 Type 2 diabetes mellitus without complications: Secondary | ICD-10-CM | POA: Diagnosis not present

## 2021-08-15 DIAGNOSIS — Z7984 Long term (current) use of oral hypoglycemic drugs: Secondary | ICD-10-CM | POA: Diagnosis not present

## 2021-08-15 DIAGNOSIS — Z79899 Other long term (current) drug therapy: Secondary | ICD-10-CM | POA: Insufficient documentation

## 2021-08-15 DIAGNOSIS — R339 Retention of urine, unspecified: Secondary | ICD-10-CM | POA: Diagnosis present

## 2021-08-15 DIAGNOSIS — Z87891 Personal history of nicotine dependence: Secondary | ICD-10-CM | POA: Diagnosis not present

## 2021-08-15 DIAGNOSIS — Z7982 Long term (current) use of aspirin: Secondary | ICD-10-CM | POA: Insufficient documentation

## 2021-08-15 DIAGNOSIS — K5900 Constipation, unspecified: Secondary | ICD-10-CM | POA: Diagnosis not present

## 2021-08-15 LAB — COMPREHENSIVE METABOLIC PANEL
ALT: 31 U/L (ref 0–44)
AST: 31 U/L (ref 15–41)
Albumin: 4.5 g/dL (ref 3.5–5.0)
Alkaline Phosphatase: 62 U/L (ref 38–126)
Anion gap: 9 (ref 5–15)
BUN: 11 mg/dL (ref 6–20)
CO2: 24 mmol/L (ref 22–32)
Calcium: 9.2 mg/dL (ref 8.9–10.3)
Chloride: 102 mmol/L (ref 98–111)
Creatinine, Ser: 0.61 mg/dL (ref 0.44–1.00)
GFR, Estimated: >60 mL/min (ref >60)
Glucose, Bld: 114 mg/dL — ABNORMAL HIGH (ref 70–99)
Potassium: 4 mmol/L (ref 3.5–5.1)
Sodium: 135 mmol/L (ref 135–145)
Total Bilirubin: 1 mg/dL (ref 0.3–1.2)
Total Protein: 8 g/dL (ref 6.5–8.1)

## 2021-08-15 LAB — URINALYSIS, COMPLETE (UACMP) WITH MICROSCOPIC
Bacteria, UA: NONE SEEN
Bilirubin Urine: NEGATIVE
Glucose, UA: NEGATIVE mg/dL
Hgb urine dipstick: NEGATIVE
Ketones, ur: 5 mg/dL — AB
Leukocytes,Ua: NEGATIVE
Nitrite: NEGATIVE
Protein, ur: NEGATIVE mg/dL
Specific Gravity, Urine: 1.003 — ABNORMAL LOW (ref 1.005–1.030)
Squamous Epithelial / HPF: NONE SEEN (ref 0–5)
WBC, UA: NONE SEEN WBC/hpf (ref 0–5)
pH: 6 (ref 5.0–8.0)

## 2021-08-15 LAB — LIPASE, BLOOD: Lipase: 34 U/L (ref 11–51)

## 2021-08-15 LAB — CBC WITH DIFFERENTIAL/PLATELET
Abs Immature Granulocytes: 0.04 10*3/uL (ref 0.00–0.07)
Basophils Absolute: 0.1 10*3/uL (ref 0.0–0.1)
Basophils Relative: 0 %
Eosinophils Absolute: 0.1 10*3/uL (ref 0.0–0.5)
Eosinophils Relative: 1 %
HCT: 40.5 % (ref 36.0–46.0)
Hemoglobin: 14.6 g/dL (ref 12.0–15.0)
Immature Granulocytes: 0 %
Lymphocytes Relative: 17 %
Lymphs Abs: 2.6 10*3/uL (ref 0.7–4.0)
MCH: 32.8 pg (ref 26.0–34.0)
MCHC: 36 g/dL (ref 30.0–36.0)
MCV: 91 fL (ref 80.0–100.0)
Monocytes Absolute: 1 10*3/uL (ref 0.1–1.0)
Monocytes Relative: 7 %
Neutro Abs: 11.5 10*3/uL — ABNORMAL HIGH (ref 1.7–7.7)
Neutrophils Relative %: 75 %
Platelets: 231 10*3/uL (ref 150–400)
RBC: 4.45 MIL/uL (ref 3.87–5.11)
RDW: 12 % (ref 11.5–15.5)
WBC: 15.3 10*3/uL — ABNORMAL HIGH (ref 4.0–10.5)
nRBC: 0 % (ref 0.0–0.2)

## 2021-08-15 LAB — CBG MONITORING, ED: Glucose-Capillary: 110 mg/dL — ABNORMAL HIGH (ref 70–99)

## 2021-08-15 MED ORDER — DOCUSATE SODIUM 100 MG PO CAPS: 100.0000 mg | ORAL_CAPSULE | Freq: Two times a day (BID) | ORAL | 0 refills | Status: DC

## 2021-08-15 MED ORDER — DOCUSATE SODIUM 100 MG PO CAPS: 100.0000 mg | ORAL_CAPSULE | Freq: Two times a day (BID) | ORAL | 0 refills | Status: AC

## 2021-08-15 NOTE — ED Triage Notes (Signed)
Pt arrives via ems from home states her last bm was a couple days ago, pt states that she hasn't been able to void since this am, states that her abd is hurting to the point she can't move due to the pain and states that she feels like she is about to bust wide open with urine, states this has never happened before

## 2021-08-15 NOTE — ED Provider Notes (Signed)
[facility] Emergency Department Provider Note   ____________________________________________   None    (approximate)  I have reviewed the triage vital signs and the nursing notes.   HISTORY  Chief Complaint Urinary Retention and Constipation    HPI Andrea [name] is a 55 y.o. female with past medical history of schizophrenia, fibromyalgia, DVT, and diabetes who presents to the ED complaining of urinary retention.  Patient reports that she has been constipated for the past 3 to 4 days with significant difficulty having a bowel movement.  She then noticed shortly after waking up this morning that she was having difficulty urinating.  She was having severe pain in her lower abdomen, which felt more distended than usual.  She denies any associated dysuria, hematuria, nausea, or vomiting.  Bladder scan performed in triage and found to have greater than 999 mL of urine, Foley catheter subsequently placed with return of 1700 cc of urine.  Patient reports feeling much better following catheter placement, denies ongoing abdominal pain.        Past Medical History:  Diagnosis Date   Diabetes mellitus without complication ([place])    DVT (deep venous thrombosis) (HCC)    Fibromyalgia    Head ache    Prediabetes    Schizophrenia ([place])    Thyroid disease     There are no problems to display for this patient.   Past Surgical History:  Procedure Laterality Date   BREAST CYST ASPIRATION Left    long long time ago   SALPINGECTOMY Right     Prior to Admission medications   Medication Sig Start Date End Date Taking? Authorizing Provider  docusate sodium (COLACE) 100 MG capsule Take 1 capsule (100 mg total) by mouth 2 (two) times daily. 08/15/21 09/14/21 Yes Blake Divine, MD  acetaminophen (TYLENOL) 500 MG tablet Take 500 mg by mouth every 6 (six) hours as needed.    [provider]  aspirin EC 81 MG tablet Take 81 mg by mouth daily.    [provider]  Atorvastatin Calcium (LIPITOR PO) Take by mouth.    [provider]  gabapentin (NEURONTIN) 100 MG capsule Take 100 mg by mouth 3 (three) times daily.    [provider]  levothyroxine (SYNTHROID, LEVOTHROID) 137 MCG tablet Take 137 mcg by mouth daily before breakfast.    [provider]  metFORMIN (GLUCOPHAGE) 500 MG tablet Take by mouth 2 (two) times daily with a meal.    [provider]  nystatin cream (MYCOSTATIN) Apply 1 application topically 2 (two) times daily.    [provider]  OLANZapine zydis (ZYPREXA) 10 MG disintegrating tablet Take 10 mg by mouth at bedtime.    [provider]  polyethylene glycol (MIRALAX) packet Take 17 g by mouth 2 (two) times daily. 04/25/16   [name], MD  Wheat Dextrin (BENEFIBER DRINK MIX PO) Take 1 packet by mouth daily.    [provider]    Allergies Bee venom  Family History  Problem Relation Age of Onset   Colon cancer Mother    Diabetes Mother    Mental illness Mother    Colon cancer Father    Breast cancer Neg Hx     Social History Social History   Tobacco Use   Smoking status: Former    Types: Cigarettes    Quit date: 08/01/2007    Years since quitting: 14.0   Smokeless tobacco: Never  Vaping Use   Vaping Use:  Never used  Substance Use Topics   Alcohol use: No   Drug use: No    Review of Systems  Constitutional: No fever/chills Eyes: No visual changes. ENT: No sore throat. Cardiovascular: Denies chest pain. Respiratory: Denies shortness of breath. Gastrointestinal: Positive for abdominal pain.  No nausea, no vomiting.  No diarrhea.  No constipation. Genitourinary: Negative for dysuria.  Positive for urinary retention. Musculoskeletal: Negative for back pain. Skin: Negative for rash. Neurological: Negative for headaches, focal weakness or numbness.  ____________________________________________   PHYSICAL EXAM:  VITAL  SIGNS: ED Triage Vitals  Enc Vitals Group     BP 08/15/21 1532 122/81     Pulse Rate 08/15/21 1532 87     Resp 08/15/21 1532 16     Temp 08/15/21 1532 97.7 F (36.5 C)     Temp Source 08/15/21 1532 Oral     SpO2 08/15/21 1532 96 %     Weight 08/15/21 1533 209 lb (94.8 kg)     Height 08/15/21 1533 5\' 6"  (1.676 m)     Head Circumference --      Peak Flow --      Pain Score 08/15/21 1532 10     Pain Loc --      Pain Edu? --      Excl. in [place]? --     Constitutional: Alert and oriented. Eyes: Conjunctivae are normal. Head: Atraumatic. Nose: No congestion/rhinnorhea. Mouth/Throat: Mucous membranes are moist. Neck: Normal ROM Cardiovascular: Normal rate, regular rhythm. Grossly normal heart sounds.  2+ radial pulses bilaterally. Respiratory: Normal respiratory effort.  No retractions. Lungs CTAB. Gastrointestinal: Soft and nontender. No distention. Genitourinary: Foley catheter draining clear yellow urine. Musculoskeletal: No lower extremity tenderness nor edema. Neurologic:  Normal speech and language. No gross focal neurologic deficits are appreciated. Skin:  Skin is warm, dry and intact. No rash noted. Psychiatric: Mood and affect are normal. Speech and behavior are normal.  ____________________________________________   LABS (all labs ordered are listed, but only abnormal results are displayed)  Labs Reviewed  CBC WITH DIFFERENTIAL/PLATELET - Abnormal; Notable for the following components:      Result Value   WBC 15.3 (*)    Neutro Abs 11.5 (*)    All other components within normal limits  COMPREHENSIVE METABOLIC PANEL - Abnormal; Notable for the following components:   Glucose, Bld 114 (*)    All other components within normal limits  URINALYSIS, COMPLETE (UACMP) WITH MICROSCOPIC - Abnormal; Notable for the following components:   Color, Urine STRAW (*)    APPearance CLEAR (*)    Specific Gravity, Urine 1.003 (*)    Ketones, ur 5 (*)    All other components within  normal limits  CBG MONITORING, ED - Abnormal; Notable for the following components:   Glucose-Capillary 110 (*)    All other components within normal limits  LIPASE, BLOOD    PROCEDURES  Procedure(s) performed (including Critical Care):  Procedures   ____________________________________________   INITIAL IMPRESSION / ASSESSMENT AND PLAN / ED COURSE      55 year old female with past medical history of schizophrenia, fibromyalgia, diabetes, and DVT who presents to the ED complaining of constipation for the past 3 to 4 days and difficulty urinating since earlier today with severe lower abdominal pain.  Patient had Foley catheter placed in triage which with return of greater than 1700 cc of urine, now feels much better with resolution of pain.  UA shows no signs of infection and labs are unremarkable, renal function  within normal limits with no electrolyte abnormality.  Patient's urinary retention is likely due to recent constipation, she is appropriate for discharge home and was counseled on bowel regimen.  She was counseled to follow-up with urology as well as her PCP, counseled to return to the ED for new worsening symptoms.  Patient agrees with plan.      ____________________________________________   FINAL CLINICAL IMPRESSION(S) / ED DIAGNOSES  Final diagnoses:  Urinary retention  Constipation, unspecified constipation type     ED Discharge Orders          Ordered    docusate sodium (COLACE) 100 MG capsule  2 times daily        08/15/21 2105             Note:  This document was prepared using Dragon voice recognition software and may include unintentional dictation errors.    Chesley Noon, MD 08/15/21 2112

## 2021-08-15 NOTE — ED Provider Notes (Signed)
Emergency Medicine Provider Triage Evaluation Note  Roniesha Hollingshead , a 55 y.o. female  was evaluated in triage.  Pt complains of recent constipation for the past 2 to 3 days.  Patient states that her abdominal discomfort has acutely worsened in severity despite using MiraLAX at home.  She states that she has been unable to urinate at home for the past 12 hours and explains this is why she is here in the emergency department currently.  She denies low back pain.  No prior history of small bowel obstruction.    Review of Systems  Positive: Patient has abdominal pain and urinary retention.  Negative: Patient denies chest pain, chest tightness or SOB.   Physical Exam  There were no vitals taken for this visit. Gen:   Awake, no distress   Resp:  Normal effort  MSK:   Moves extremities without difficulty  Other:    Medical Decision Making  Medically screening exam initiated at 3:31 PM.  Appropriate orders placed.  Shaquisha Gillie was informed that the remainder of the evaluation will be completed by another provider, this initial triage assessment does not replace that evaluation, and the importance of remaining in the ED until their evaluation is complete.     Pia Mau [place], PA-C 08/15/21 1533    Shaune Pollack, MD 08/16/21 1500

## 2021-08-15 NOTE — ED Notes (Signed)
Bladder scan performed with greater than 999 revealed, Andrea [name] aware and ok'd  to place a foley in

## 2021-08-23 ENCOUNTER — Other Ambulatory Visit: Payer: Self-pay

## 2021-08-23 ENCOUNTER — Encounter: Payer: Self-pay | Admitting: Urology

## 2021-08-23 ENCOUNTER — Ambulatory Visit (INDEPENDENT_AMBULATORY_CARE_PROVIDER_SITE_OTHER): Payer: Medicaid Other | Admitting: Urology

## 2021-08-23 VITALS — BP 121/79 | HR 90 | Ht 66.0 in | Wt 205.4 lb

## 2021-08-23 DIAGNOSIS — F200 Paranoid schizophrenia: Secondary | ICD-10-CM | POA: Insufficient documentation

## 2021-08-23 DIAGNOSIS — E039 Hypothyroidism, unspecified: Secondary | ICD-10-CM | POA: Insufficient documentation

## 2021-08-23 DIAGNOSIS — Z466 Encounter for fitting and adjustment of urinary device: Secondary | ICD-10-CM | POA: Diagnosis not present

## 2021-08-23 DIAGNOSIS — K5900 Constipation, unspecified: Secondary | ICD-10-CM | POA: Diagnosis not present

## 2021-08-23 DIAGNOSIS — R339 Retention of urine, unspecified: Secondary | ICD-10-CM | POA: Diagnosis not present

## 2021-08-23 MED ORDER — CEPHALEXIN 250 MG PO CAPS
500.0000 mg | ORAL_CAPSULE | Freq: Once | ORAL | Status: AC
  Administered 2021-08-23: 500 mg via ORAL

## 2021-08-23 NOTE — Progress Notes (Signed)
Catheter Removal  Patient is present today for a catheter removal. 32ml of water was drained from the balloon. A 16FR foley cath was removed from the bladder no complications were noted. Patient tolerated well.  Performed by: Debbe Bales, CMA  Follow up/ Additional notes: RTC in 6 weeks for PVR

## 2021-08-23 NOTE — Patient Instructions (Signed)

## 2021-08-23 NOTE — Progress Notes (Signed)
   08/23/21 10:14 AM   Andrea [name] 09-Feb-1966 [id number]  CC: Urinary retention, constipation  HPI: 55 year old female with multiple medical issues including well-controlled diabetes(hemoglobin A1c 5.9), schizophrenia, fibromyalgia who presented to the ED on 08/15/2021 with constipation and urinary retention.  Bladder scan was over 1 L, and Foley catheter was placed.  Urinalysis was completely benign, and renal function was normal.  Suspect this was secondary to acute constipation as she had not had a bowel movement in a few days.  She denies any urinary symptoms prior to this, specifically any incontinence, dysuria, or gross hematuria.  She is here with her sister today who provides most of the history.  I reviewed the outside ER notes as well.   PMH: Past Medical History:  Diagnosis Date   Diabetes mellitus without complication (HCC)    DVT (deep venous thrombosis) (HCC)    Fibromyalgia    Head ache    Prediabetes    Schizophrenia (HCC)    Thyroid disease     Surgical History: Past Surgical History:  Procedure Laterality Date   BREAST CYST ASPIRATION Left    long long time ago   SALPINGECTOMY Right      Family History: Family History  Problem Relation Age of Onset   Colon cancer Mother    Diabetes Mother    Mental illness Mother    Colon cancer Father    Breast cancer Neg Hx     Social History:  reports that she quit smoking about 14 years ago. Her smoking use included cigarettes. She has never used smokeless tobacco. She reports that she does not drink alcohol and does not use drugs.  Physical Exam: BP 121/79 (BP Location: Left Arm, Patient Position: Sitting, Cuff Size: Large)   Pulse 90   Ht 5\' 6"  (1.676 m)   Wt 205 lb 6.4 oz (93.2 kg)   BMI 33.15 kg/m    Constitutional: Tangential thought process, pressured speech Cardiovascular: No clubbing, cyanosis, or edema. Respiratory: Normal respiratory effort, no increased work of breathing. Foley with yellow  urine  Laboratory Data: Reviewed in epic, normal renal function  Assessment & Plan:   55 year old female with schizophrenia who developed urinary retention after a severe bout of constipation.  She denied any urinary symptoms or gross hematuria prior to this episode.  Her bowels have been back to normal over the last few days.  Renal function was normal, and urinalysis was completely benign.  -Keflex given for prophylaxis and Foley removed today, return precautions discussed at length -RTC 4 to 6 weeks PVR, if normal can follow-up as needed at that time  58, MD 08/23/2021  [facility] [address], [address] [place], [place] [phone]

## 2021-08-31 ENCOUNTER — Telehealth: Payer: Self-pay

## 2021-08-31 ENCOUNTER — Other Ambulatory Visit: Payer: Self-pay

## 2021-08-31 ENCOUNTER — Encounter: Payer: Self-pay | Admitting: Urology

## 2021-08-31 ENCOUNTER — Ambulatory Visit (INDEPENDENT_AMBULATORY_CARE_PROVIDER_SITE_OTHER): Payer: Medicaid Other | Admitting: Urology

## 2021-08-31 VITALS — BP 137/80 | HR 83 | Ht 66.0 in | Wt 205.0 lb

## 2021-08-31 DIAGNOSIS — R300 Dysuria: Secondary | ICD-10-CM

## 2021-08-31 LAB — BLADDER SCAN AMB NON-IMAGING

## 2021-08-31 MED ORDER — NITROFURANTOIN MONOHYD MACRO 100 MG PO CAPS: 100.0000 mg | ORAL_CAPSULE | Freq: Two times a day (BID) | ORAL | 0 refills | Status: DC

## 2021-08-31 NOTE — Progress Notes (Signed)
08/31/21 2:53 PM   Andrea [name] 10-Dec-1965 [id number]  Referring provider:  Elza Rafter, MD [address] [place],  [place] [address]  Chief Complaint  Patient presents with   Follow-up    Dysuria    Urological history  Urinary retention  - Renal function normal and urinalysis benign on 08/23/2021 visit with Dr.Sninsky  - Foley catheter removed on 08/23/2021   HPI: Andrea [name] is a 55 y.o.female with a personal history of urinary retention.   She reports that her symptoms started with increased urination and burning during urination. Her urine was tested in the ED and that she did not have an infection she had a catheter placed due to retention with greater than 999 mL of urine. She is still having pain in her lower abdomen, which has been feeling like her bladder is distended. She reports today that her symptoms have returned.   She reports her bladder dropped and that she strengthened her muscles she went to PT.  She has been experiencing constipation   Her urine today has >30 wbcs and a few bacteria.   PVR 42 mL   PMH: Past Medical History:  Diagnosis Date   Diabetes mellitus without complication (HCC)    DVT (deep venous thrombosis) (HCC)    Fibromyalgia    Head ache    Prediabetes    Schizophrenia ([place])    Thyroid disease     Surgical History: Past Surgical History:  Procedure Laterality Date   BREAST CYST ASPIRATION Left    long long time ago   SALPINGECTOMY Right     Home Medications:  Allergies as of 08/31/2021       Reactions   Bee Venom Swelling        Medication List        Accurate as of August 31, 2021  2:53 PM. If you have any questions, ask your nurse or doctor.          acetaminophen 500 MG tablet Commonly known as: TYLENOL Take 500 mg by mouth every 6 (six) hours as needed.   aspirin EC 81 MG tablet Take 81 mg by mouth daily.   atorvastatin 20 MG tablet Commonly known as: LIPITOR Take by mouth.    BENEFIBER DRINK MIX PO Take 1 packet by mouth daily.   docusate sodium 100 MG capsule Commonly known as: Colace Take 1 capsule (100 mg total) by mouth 2 (two) times daily.   gabapentin 100 MG capsule Commonly known as: NEURONTIN Take by mouth.   levothyroxine 125 MCG tablet Commonly known as: SYNTHROID Take by mouth.   metFORMIN 500 MG 24 hr tablet Commonly known as: GLUCOPHAGE-XR Take by mouth.   nitrofurantoin (macrocrystal-monohydrate) 100 MG capsule Commonly known as: MACROBID Take 1 capsule (100 mg total) by mouth every 12 (twelve) hours. Started by: Zara Council, PA-C   nystatin cream Commonly known as: MYCOSTATIN Apply 1 application topically 2 (two) times daily.   OLANZapine zydis 10 MG disintegrating tablet Commonly known as: ZYPREXA Take 10 mg by mouth at bedtime.   polyethylene glycol 17 g packet Commonly known as: MiraLax Take 17 g by mouth 2 (two) times daily.        Allergies:  Allergies  Allergen Reactions   Bee Venom Swelling    Family History: Family History  Problem Relation Age of Onset   Colon cancer Mother    Diabetes Mother    Mental illness Mother    Colon cancer Father  Breast cancer Neg Hx     Social History:  reports that she quit smoking about 14 years ago. Her smoking use included cigarettes. She has never used smokeless tobacco. She reports that she does not drink alcohol and does not use drugs.   Physical Exam: BP 137/80   Pulse 83   Ht 5\' 6"  (1.676 m)   Wt 205 lb (93 kg)   BMI 33.09 kg/m   Constitutional:  Well nourished. Alert and oriented, No acute distress. HEENT: [place] AT, mask in place.  Trachea midline Cardiovascular: No clubbing, cyanosis, or edema. Respiratory: Normal respiratory effort, no increased work of breathing. Neurologic: Grossly intact, no focal deficits, moving all 4 extremities. Psychiatric: Normal mood and affect.    Laboratory Data: Lab Results  Component Value Date   CREATININE 0.61  08/15/2021   Component     Latest Ref Rng & Units 08/15/2021  Sodium     135 - 145 mmol/L 135  Potassium     3.5 - 5.1 mmol/L 4.0  Chloride     98 - 111 mmol/L 102  CO2     22 - 32 mmol/L 24  Glucose     70 - 99 mg/dL 114 (H)  BUN     6 - 20 mg/dL 11  Creatinine     0.44 - 1.00 mg/dL 0.61  Calcium     8.9 - 10.3 mg/dL 9.2  Total Protein     6.5 - 8.1 g/dL 8.0  Albumin     3.5 - 5.0 g/dL 4.5  AST     15 - 41 U/L 31  ALT     0 - 44 U/L 31  Alkaline Phosphatase     38 - 126 U/L 62  Total Bilirubin     0.3 - 1.2 mg/dL 1.0  GFR, Estimated     >60 mL/min >60  Anion gap     5 - 15 9   Component     Latest Ref Rng & Units 08/15/2021  WBC     4.0 - 10.5 K/uL 15.3 (H)  RBC     3.87 - 5.11 MIL/uL 4.45  Hemoglobin     12.0 - 15.0 g/dL 14.6  HCT     36.0 - 46.0 % 40.5  MCV     80.0 - 100.0 fL 91.0  MCH     26.0 - 34.0 pg 32.8  MCHC     30.0 - 36.0 g/dL 36.0  RDW     11.5 - 15.5 % 12.0  Platelets     150 - 400 K/uL 231  nRBC     0.0 - 0.2 % 0.0  Neutrophils     % 75  NEUT#     1.7 - 7.7 K/uL 11.5 (H)  Lymphocytes     % 17  Lymphocyte #     0.7 - 4.0 K/uL 2.6  Monocytes Relative     % 7  Monocyte #     0.1 - 1.0 K/uL 1.0  Eosinophil     % 1  Eosinophils Absolute     0.0 - 0.5 K/uL 0.1  Basophil     % 0  Basophils Absolute     0.0 - 0.1 K/uL 0.1  Immature Granulocytes     % 0  Abs Immature Granulocytes     0.00 - 0.07 K/uL 0.04    Ref Range & Units 1 mo ago  Thyroid Stimulating Hormone (TSH) 0.34 - 5.66 IU/mL 0.39  Resulting Agency  [facility]  Specimen Collected: 07/21/21 10:52 Last Resulted: 07/21/21 16:37  Received From: [facility]  Result Received: 08/15/21 14:16    Ref Range & Units 1 mo ago  Hemoglobin A1C <6.5 % 6.0   Average Blood Glucose (Calculated From HgBA1c Level) mg/dL 952   Resulting Agency  [facility]  Narrative Performed by [facility] The ADA has made the following Recommendations:  HBA1C results between 5.7% and 6.4% are suggestive of prediabetes  HBA1C result greater than or equal to 6.5% are diagnostic of diabetes Specimen Collected: 07/21/21 10:52 Last Resulted: 07/21/21 21:09  Received From: [facility]  Result Received: 08/23/21 09:35    Contains abnormal data [facility] POC Microalbumin Order: [id number]  Ref Range & Units 1 mo ago  [facility] Microalbumin/Creatinine Ratio <30 mg/g 30-300 Abnormal    Resulting Agency  [facility]  Narrative Performed by [facility] POC TEST(S) ABOVE PERFORMED AT THE PATIENT CARE LOCATION AND OVERSEEN BY THE [facility] POCT PROGRAM. Specimen Collected: 07/21/21 10:52 Last Resulted: 07/21/21 11:00  Received From: [facility]  Result Received: 08/23/21 09:35    Urinalysis Component     Latest Ref Rng & Units 08/31/2021  Specific Gravity, UA     1.005 - 1.030 1.010  pH, UA     5.0 - 7.5 7.0  Color, UA     Yellow Yellow  Appearance Ur     Clear Cloudy (A)  Leukocytes,UA     Negative 3+ (A)  Protein,UA     Negative/Trace Trace (A)  Glucose, UA     Negative Negative  Ketones, UA     Negative Negative  RBC, UA     Negative 2+ (A)  Bilirubin, UA     Negative Negative  Urobilinogen, Ur     0.2 - 1.0 mg/dL 0.2  Nitrite, UA     Negative Negative  Microscopic Examination      See below:   Component     Latest Ref Rng & Units 08/31/2021  WBC, UA     0 - 5 /hpf >30 (A)  RBC     0 - 2 /hpf 0-2  Epithelial Cells (non renal)     0 - 10 /hpf 0-10  Bacteria, UA     None seen/Few Few  I have reviewed the labs.   Pertinent Imaging: Results for orders placed or performed in visit on 08/31/21  BLADDER SCAN AMB NON-IMAGING  Result Value Ref Range   Scan Result 82ml      Assessment & Plan:   Dysuria  - UA with pyuria  -Urine sent for culture  - started on Macrobid x2 daily  - Will adjust accordingly to urine  culture   Follow-up with Dr.Sninky in December  [facility] [address], [address] [place], [place] [address] [phone]  I,Kailey Littlejohn,acting as a scribe for [employer], PA-C.,have documented all relevant documentation on the behalf of Andrea Khurana, PA-C,as directed by  [facility], PA-C while in the presence of Monica Codd, PA-C.  I have reviewed the above documentation for accuracy and completeness, and I agree with the above.    Michiel Cowboy, PA-C

## 2021-08-31 NOTE — Telephone Encounter (Addendum)
Patient called this morning complaining of dysuria, itching, and decreased urine output. I offered patient an appointment to come to [place] this morning. Patient states she is unable to get here. Patient denied appointment. She said she can only get to the [place] clinic. I advised pt we will not be in [place] until Monday. Pt states she will call her PCP. Patient states she called yesterday and was told that with the symptoms she was having she did not need to be seen.

## 2021-09-01 LAB — URINALYSIS, COMPLETE
Bilirubin, UA: NEGATIVE
Glucose, UA: NEGATIVE
Ketones, UA: NEGATIVE
Nitrite, UA: NEGATIVE
Specific Gravity, UA: 1.01 (ref 1.005–1.030)
Urobilinogen, Ur: 0.2 mg/dL (ref 0.2–1.0)
pH, UA: 7 (ref 5.0–7.5)

## 2021-09-01 LAB — MICROSCOPIC EXAMINATION: WBC, UA: >30 /hpf — AB (ref 0–5)

## 2021-09-06 LAB — CULTURE, URINE COMPREHENSIVE

## 2021-10-04 ENCOUNTER — Ambulatory Visit: Payer: Medicaid Other | Admitting: Urology

## 2021-10-11 ENCOUNTER — Ambulatory Visit: Payer: Medicaid Other | Admitting: Urology

## 2021-10-18 ENCOUNTER — Encounter: Payer: Self-pay | Admitting: Urology

## 2021-10-18 ENCOUNTER — Ambulatory Visit (INDEPENDENT_AMBULATORY_CARE_PROVIDER_SITE_OTHER): Payer: Medicaid Other | Admitting: Urology

## 2021-10-18 ENCOUNTER — Other Ambulatory Visit: Payer: Self-pay

## 2021-10-18 VITALS — BP 123/77 | HR 65 | Ht 67.0 in | Wt 195.0 lb

## 2021-10-18 DIAGNOSIS — R339 Retention of urine, unspecified: Secondary | ICD-10-CM | POA: Diagnosis not present

## 2021-10-18 LAB — BLADDER SCAN AMB NON-IMAGING

## 2021-10-18 NOTE — Progress Notes (Signed)
° °  10/18/2021 9:58 AM   Irine Seal Jan 29, 1966 [id number]  Reason for visit: Follow up urinary retention, UTI  HPI: 56 year old female with schizophrenia who developed urinary retention in October 2022 that was associated with severe constipation.  She passed a voiding trial, and was seen by Zara Council, PA on 08/31/2021 with primary complaint of dysuria.  PVR was normal at 40 mL at that time, but she did have pyuria on urinalysis, and urine culture ultimately grew a small amount of Enterococcus and Staphylococcus, and she was treated with culture appropriate Macrobid.  Her symptoms resolved on antibiotics.  She denies any complaints today and is urinating with a good stream.  PVR is normal today at 51 mL.  She has some mild urinary frequency when she drinks a fair amount of tea, but denies any incontinence.  Return precautions and behavioral strategies discussed, follow-up with urology as needed   [place], [place] [address] [place], [place] [address] [phone]

## 2022-03-03 IMAGING — MG DIGITAL DIAGNOSTIC BILAT W/ TOMO W/ CAD
8 of 14 series · 8 of 40 positions shown · non-contrast
Comparison: Previous exam(s).

CLINICAL DATA: 53-year-old female presenting with a palpable lump
on the right breast at about 2 o'clock. This has been immediately
underneath the skin and present for 2 years.

EXAM:
DIGITAL DIAGNOSTIC BILATERAL MAMMOGRAM WITH CAD AND TOMO

[L CC synth-2D]
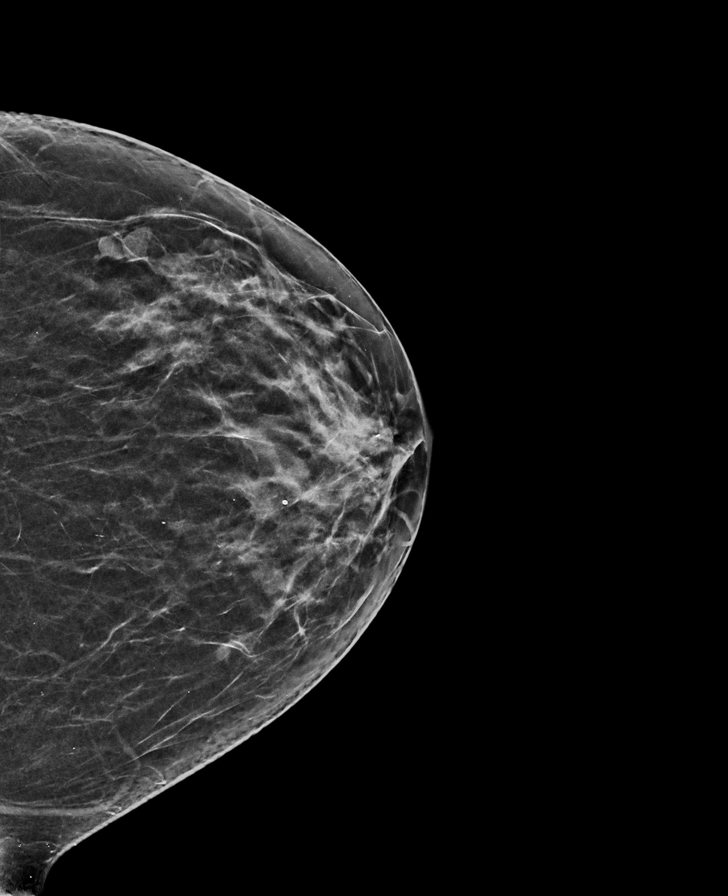

[R MLO synth-2D (1 of 3)]
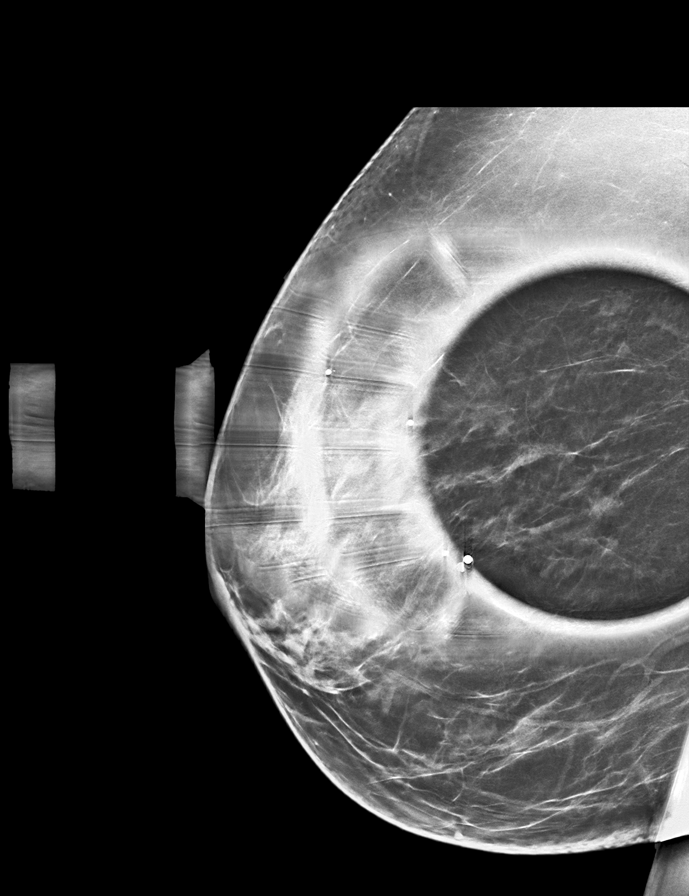

[R LMO synth-2D]
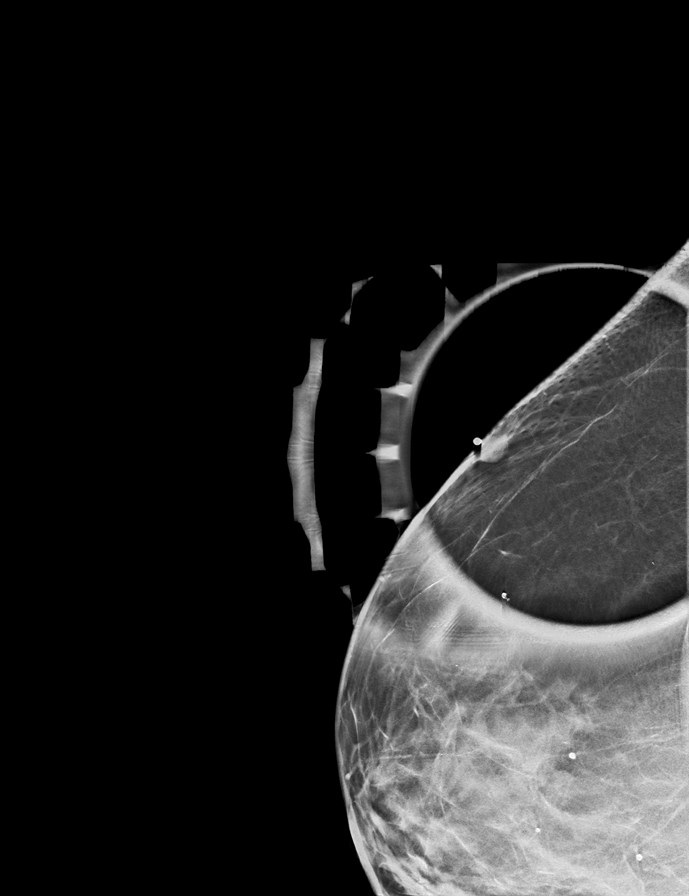

[R MLO synth-2D (2 of 3)]
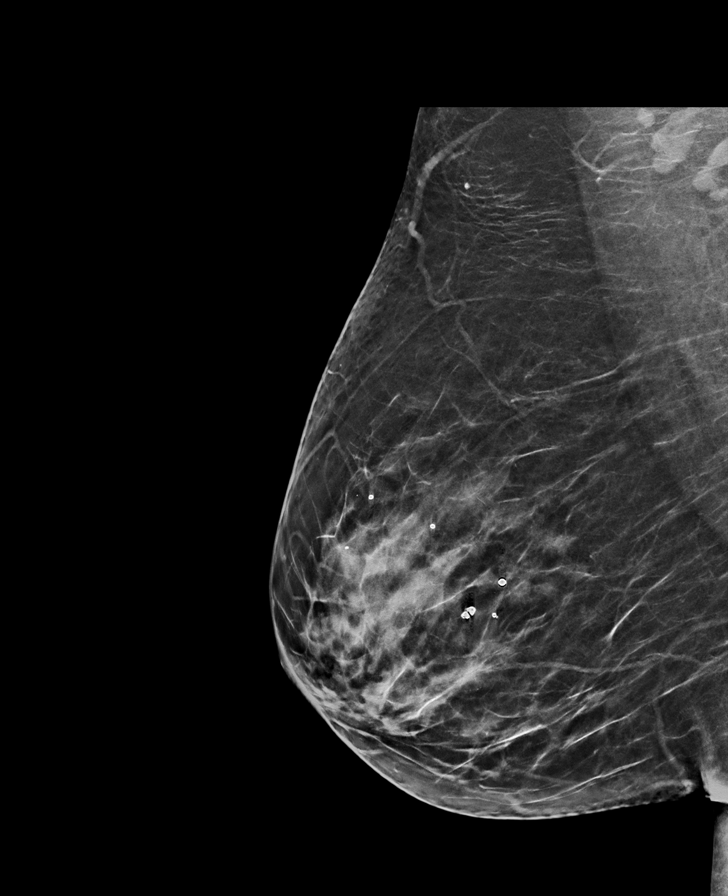

[R CC synth-2D]
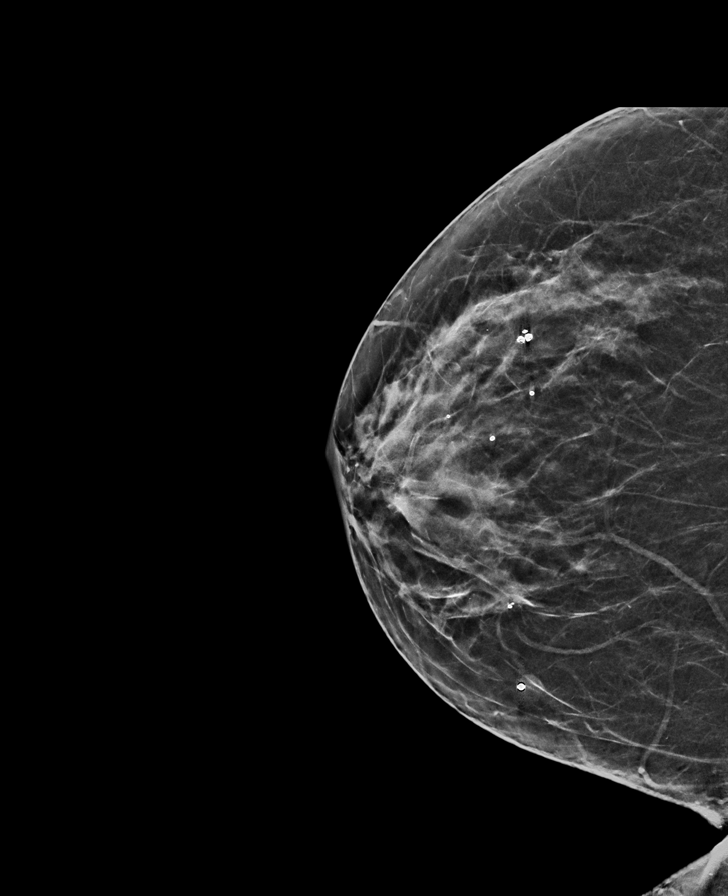

[R MLO synth-2D (3 of 3)]
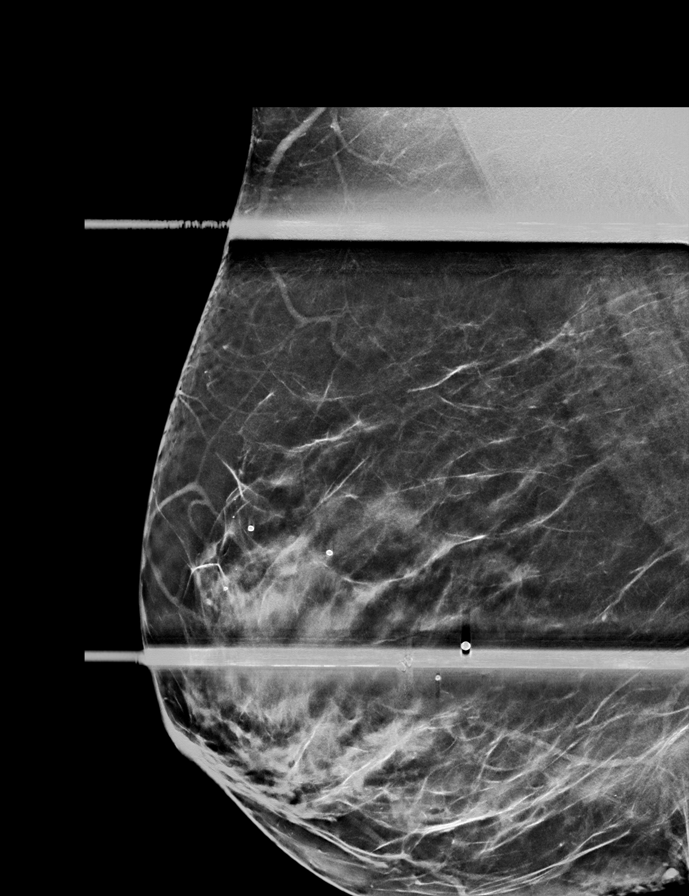

[L MLO synth-2D]
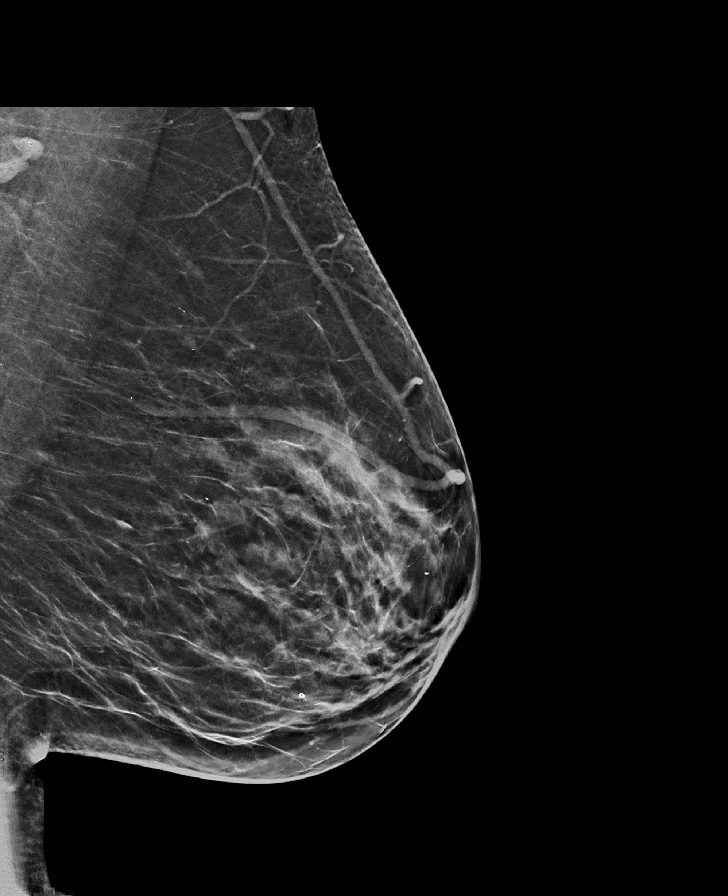

[R LMO BREAST TOMOSYNTHESIS IMAGE tomo · tomo slice 25/49.0]
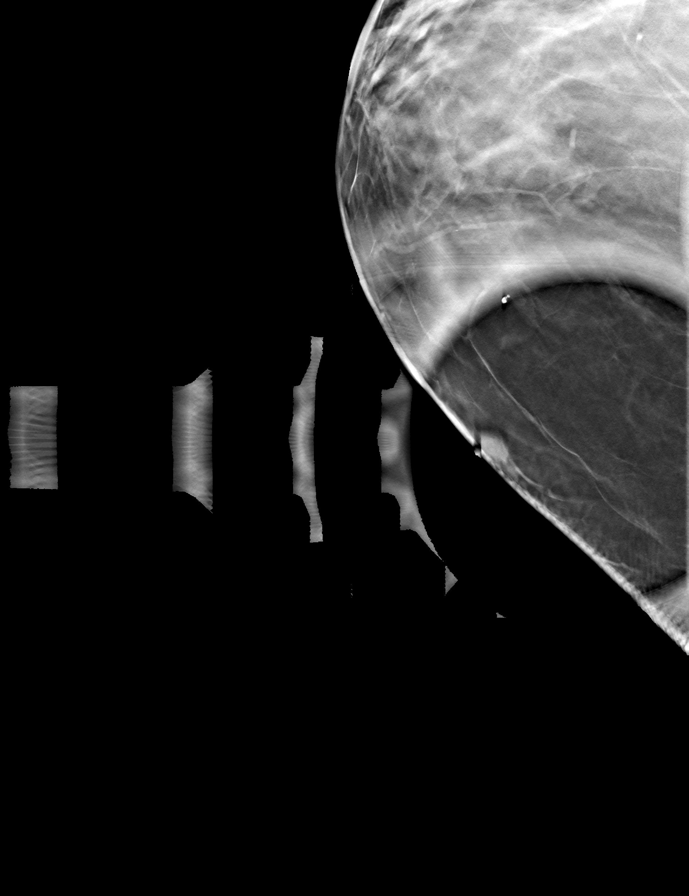

[8 of 40 positions shown; findings below may reference images not displayed]

ACR Breast Density Category c: The breast tissue is heterogeneously
dense, which may obscure small masses.
FINDINGS: Spot compression tomosynthesis imaging of the palpable site in the
right breast demonstrates a superficial oval mass measuring
approximately 9 mm. This demonstrates obtuse margins with the skin,
which is consistent with a skin lesion, likely a sebaceous cyst.

Mammographic images were processed with CAD.

Physical exam of the palpable site demonstrates a small superficial
slightly protruding mass with a central punctum consistent with a
sebaceous cyst.
IMPRESSION: 1. The palpable site in the right breast corresponds with a benign
sebaceous cyst.

2.  No evidence of malignancy in the bilateral breasts.

RECOMMENDATION:
Screening mammogram in one year.(Code:36-L-ISM)

I have discussed the findings and recommendations with the patient.
If applicable, a reminder letter will be sent to the patient
regarding the next appointment.

BI-RADS CATEGORY  2: Benign.

## 2022-05-02 ENCOUNTER — Other Ambulatory Visit: Payer: Self-pay | Admitting: Family Medicine

## 2022-05-02 DIAGNOSIS — Z1231 Encounter for screening mammogram for malignant neoplasm of breast: Secondary | ICD-10-CM

## 2022-05-04 ENCOUNTER — Ambulatory Visit
Admission: RE | Admit: 2022-05-04 | Discharge: 2022-05-04 | Disposition: A | Discharge status: Home / Self Care | Payer: Medicaid Other | Source: Ambulatory Visit | Attending: Family Medicine | Admitting: Family Medicine

## 2022-05-04 DIAGNOSIS — Z1231 Encounter for screening mammogram for malignant neoplasm of breast: Secondary | ICD-10-CM | POA: Insufficient documentation

## 2022-05-08 ENCOUNTER — Other Ambulatory Visit: Payer: Self-pay | Admitting: Family Medicine

## 2022-05-08 DIAGNOSIS — N6489 Other specified disorders of breast: Secondary | ICD-10-CM

## 2022-05-08 DIAGNOSIS — R928 Other abnormal and inconclusive findings on diagnostic imaging of breast: Secondary | ICD-10-CM

## 2022-06-01 ENCOUNTER — Ambulatory Visit
Admission: RE | Admit: 2022-06-01 | Discharge: 2022-06-01 | Disposition: A | Discharge status: Home / Self Care | Payer: Medicaid Other | Source: Ambulatory Visit | Attending: Family Medicine | Admitting: Family Medicine

## 2022-06-01 DIAGNOSIS — R928 Other abnormal and inconclusive findings on diagnostic imaging of breast: Secondary | ICD-10-CM | POA: Insufficient documentation

## 2022-06-01 DIAGNOSIS — N6489 Other specified disorders of breast: Secondary | ICD-10-CM | POA: Diagnosis present

## 2022-06-02 ENCOUNTER — Other Ambulatory Visit: Payer: Self-pay | Admitting: Family Medicine

## 2022-06-06 ENCOUNTER — Other Ambulatory Visit: Payer: Self-pay | Admitting: Physician Assistant

## 2022-06-06 DIAGNOSIS — R928 Other abnormal and inconclusive findings on diagnostic imaging of breast: Secondary | ICD-10-CM

## 2022-06-06 DIAGNOSIS — N6489 Other specified disorders of breast: Secondary | ICD-10-CM

## 2022-06-14 ENCOUNTER — Ambulatory Visit
Admission: RE | Admit: 2022-06-14 | Discharge: 2022-06-14 | Disposition: A | Discharge status: Home / Self Care | Payer: Medicaid Other | Source: Ambulatory Visit | Attending: Physician Assistant | Admitting: Physician Assistant

## 2022-06-14 DIAGNOSIS — N6489 Other specified disorders of breast: Secondary | ICD-10-CM | POA: Diagnosis present

## 2022-06-14 DIAGNOSIS — R928 Other abnormal and inconclusive findings on diagnostic imaging of breast: Secondary | ICD-10-CM | POA: Diagnosis not present

## 2022-06-14 HISTORY — PX: BREAST BIOPSY: SHX20

## 2022-06-15 LAB — SURGICAL PATHOLOGY

## 2022-09-09 ENCOUNTER — Ambulatory Visit (INDEPENDENT_AMBULATORY_CARE_PROVIDER_SITE_OTHER): Payer: Medicaid Other

## 2022-09-09 ENCOUNTER — Ambulatory Visit
Admission: EM | Admit: 2022-09-09 | Discharge: 2022-09-09 | Disposition: A | Discharge status: Home / Self Care | Payer: Medicaid Other | Attending: Physician Assistant | Admitting: Physician Assistant

## 2022-09-09 DIAGNOSIS — M79671 Pain in right foot: Secondary | ICD-10-CM

## 2022-09-09 DIAGNOSIS — M25571 Pain in right ankle and joints of right foot: Secondary | ICD-10-CM | POA: Diagnosis not present

## 2022-09-09 DIAGNOSIS — S8012XA Contusion of left lower leg, initial encounter: Secondary | ICD-10-CM | POA: Diagnosis not present

## 2022-09-09 DIAGNOSIS — S93401A Sprain of unspecified ligament of right ankle, initial encounter: Secondary | ICD-10-CM

## 2022-09-09 NOTE — Discharge Instructions (Signed)
SPRAIN: Stressed avoiding painful activities . Reviewed RICE guidelines. Use medications as directed, including NSAIDs. If no NSAIDs have been prescribed for you today, you may take Aleve or Motrin over the counter. May use Tylenol in between doses of NSAIDs.  If no improvement in the next 1-2 weeks, f/u with PCP or return to our office for reexamination, and please feel free to call or return at any time for any questions or concerns you may have and we will be happy to help you!      I am not concerned about a blood clot but if you feel that your swelling worsens or you notice increased redness, warmth or pain, please go to emergency department for reevaluation and consideration of imaging of the left lower leg.

## 2022-09-09 NOTE — ED Triage Notes (Signed)
Pt c/o right ankle pain, foot pain, and left leg painx1week  Pt states that she was walking her dog when he took off and snatched her. Pt states that her right foot and ankle is now hurting.  Pt states that the pain goes to her toes and stops at her ankle. On the left foot, pt states that she has a knot and is worried about a blood clot.   Pt states that she can not keep weight on her foot and that the pain has gotten worse.

## 2022-09-09 NOTE — ED Notes (Signed)
Patient did not want a lace up ankle brace at this time. Salli Quarry, NP was notified.

## 2022-09-09 NOTE — ED Provider Notes (Signed)
[facility]    CSN: [id number] Arrival date & time: 09/09/22  4076      History   Chief Complaint Chief Complaint  Patient presents with   Ankle Pain   Foot Pain    HPI Andrea [name] is a 56 y.o. female presenting for injuries following an accidental fall yesterday.  Patient says that she was walking her dog and the dog started to run so she stacked the leash and the dog pulled her as well as hit its head on her left shin.  She says the dog is a pit bull.  She did not have any bites or wounds.  She reports most pain of her right ankle.  Reports that she is able to walk and bear weight but it hurts to do so.  Pain radiates through the foot and ankle.  She reports a tiny area of swelling and bruising of the left shin.  She has not treated condition in any way.  No numbness, weakness.  No other injuries or complaints.  HPI  Past Medical History:  Diagnosis Date   Diabetes mellitus without complication (HCC)    DVT (deep venous thrombosis) (HCC)    Fibromyalgia    Head ache    Prediabetes    Schizophrenia (HCC)    Thyroid disease     Patient Active Problem List   Diagnosis Date Noted   Hypothyroidism 08/23/2021   Paranoid schizophrenia (HCC) 08/23/2021   Controlled type 2 diabetes mellitus without complication, without long-term current use of insulin (HCC) 08/25/2015   Obesity (BMI 30-39.9) 08/25/2015   Persistent headaches 08/25/2015   Frequency of micturition 04/01/2013   Nocturia 04/01/2013   DVT (deep venous thrombosis) (HCC) 08/21/2012    Past Surgical History:  Procedure Laterality Date   BREAST BIOPSY Left 06/14/2022   Stereo bx, Ribbon Clip, Path pending   BREAST CYST ASPIRATION Left    long long time ago   SALPINGECTOMY Right     OB History     Gravida  2   Para  1   Term      Preterm      AB      Living  1      SAB      IAB      Ectopic      Multiple      Live Births           Obstetric Comments  Menstrual age:  27  Age 1st Pregnancy: 33           Home Medications    Prior to Admission medications   Medication Sig Start Date End Date Taking? Authorizing Provider  acetaminophen (TYLENOL) 500 MG tablet Take 500 mg by mouth every 6 (six) hours as needed.   Yes [provider]  aspirin EC 81 MG tablet Take 81 mg by mouth daily.   Yes [provider]  atorvastatin (LIPITOR) 20 MG tablet Take by mouth. 01/03/21  Yes [provider]  gabapentin (NEURONTIN) 100 MG capsule Take by mouth. 07/13/21  Yes [provider]  levothyroxine (SYNTHROID) 125 MCG tablet Take by mouth. 01/03/21  Yes [provider]  metFORMIN (GLUCOPHAGE-XR) 500 MG 24 hr tablet Take by mouth. 07/13/21  Yes [provider]  nystatin cream (MYCOSTATIN) Apply 1 application topically 2 (two) times daily.   Yes [provider]  OLANZapine zydis (ZYPREXA) 10 MG disintegrating tablet Take 10 mg by mouth at bedtime.   Yes [provider]  polyethylene glycol (MIRALAX) packet Take 17 g by mouth 2 (two) times daily. 04/25/16  Yes Isa Rankin, MD  Wheat Dextrin (BENEFIBER DRINK MIX PO) Take 1 packet by mouth daily.   Yes [provider]    Family History Family History  Problem Relation Age of Onset   Colon cancer Mother    Diabetes Mother    Mental illness Mother    Colon cancer Father    Breast cancer Neg Hx     Social History Social History   Tobacco Use   Smoking status: Former    Types: Cigarettes    Quit date: 08/01/2007    Years since quitting: 15.1   Smokeless tobacco: Never  Vaping Use   Vaping Use: Never used  Substance Use Topics   Alcohol use: No   Drug use: No     Allergies   Bee venom   Review of Systems Review of Systems  Musculoskeletal:  Positive for arthralgias and joint swelling. Negative for gait problem.  Skin:  Positive for color change. Negative for wound.  Neurological:  Negative for weakness and numbness.      Physical Exam Triage Vital Signs ED Triage Vitals  Enc Vitals Group     BP --      Pulse Rate 09/09/22 0940 61     Resp 09/09/22 0940 18     Temp 09/09/22 0940 98.5 F (36.9 C)     Temp Source 09/09/22 0940 Oral     SpO2 09/09/22 0940 97 %     Weight 09/09/22 0938 177 lb (80.3 kg)     Height 09/09/22 0938 5\' 7"  (1.702 m)     Head Circumference --      Peak Flow --      Pain Score 09/09/22 0938 8     Pain Loc --      Pain Edu? --      Excl. in GC? --    No data found.  Updated Vital Signs Pulse 61   Temp 98.5 F (36.9 C) (Oral)   Resp 18   Ht 5\' 7"  (1.702 m)   Wt 177 lb (80.3 kg)   SpO2 97%   BMI 27.72 kg/m      Physical Exam Vitals and nursing note reviewed.  Constitutional:      General: She is not in acute distress.    Appearance: Normal appearance. She is not ill-appearing or toxic-appearing.  HENT:     Head: Normocephalic and atraumatic.  Eyes:     General: No scleral icterus.       Right eye: No discharge.        Left eye: No discharge.     Conjunctiva/sclera: Conjunctivae normal.  Cardiovascular:     Rate and Rhythm: Normal rate and regular rhythm.  Pulmonary:     Effort: Pulmonary effort is normal. No respiratory distress.  Musculoskeletal:     Cervical back: Neck supple.     Left lower leg: Tenderness (mild swelling, contusion and TTP mid tib/fib) present.     Right ankle: Swelling (mild lateral ankle swelling) present. Tenderness present over the lateral malleolus, ATF ligament and base of 5th metatarsal. Normal range of motion. Normal pulse.  Skin:    General: Skin is dry.  Neurological:     General: No focal deficit present.     Mental Status: She is alert. Mental status is at baseline.     Motor: No weakness.     Gait:  Gait abnormal.  Psychiatric:        Mood and Affect: Mood normal.        Behavior: Behavior normal.        Thought Content: Thought content normal.      UC Treatments / Results  Labs (all labs ordered are  listed, but only abnormal results are displayed) Labs Reviewed - No data to display  EKG   Radiology DG Ankle Complete Right  Result Date: 09/09/2022 CLINICAL DATA:  Right ankle pain after injury last week. EXAM: RIGHT ANKLE - COMPLETE 3+ VIEW COMPARISON:  None Available. FINDINGS: There is no evidence of fracture, dislocation, or joint effusion. There is no evidence of arthropathy or other focal bone abnormality. Soft tissues are unremarkable. IMPRESSION: Negative. Electronically Signed   By: Lupita Raider M.D.   On: 09/09/2022 10:03   DG Foot Complete Right  Result Date: 09/09/2022 CLINICAL DATA:  Right foot pain after injury last week. EXAM: RIGHT FOOT COMPLETE - 3+ VIEW COMPARISON:  None Available. FINDINGS: There is no evidence of fracture or dislocation. There is no evidence of arthropathy or other focal bone abnormality. Soft tissues are unremarkable. IMPRESSION: Negative. Electronically Signed   By: Lupita Raider M.D.   On: 09/09/2022 10:01    Procedures Procedures (including critical care time)  Medications Ordered in UC Medications - No data to display  Initial Impression / Assessment and Plan / UC Course  I have reviewed the triage vital signs and the nursing notes.  Pertinent labs & imaging results that were available during my care of the patient were reviewed by me and considered in my medical decision making (see chart for details).   56 year old female presents for injuries sustained yesterday when her dog pulled while she was walking it.  Reports the dog's head hit her left shin.  Reports twisting her right ankle.  X-ray of her right ankle and foot obtained to rule out fractures.  X-rays within normal limits.  Suspect ankle sprain.  No imaging obtained of left lower leg.  Minor contusion and swelling of the anterior shin region.  Mild tenderness in this area.  Consistent with a skin contusion.  Offered ankle brace which she declines.  Reviewed RICE guidelines,  ibuprofen and Tylenol for pain relief.  Reviewed return and ER precautions.   Final Clinical Impressions(s) / UC Diagnoses   Final diagnoses:  Sprain of right ankle, unspecified ligament, initial encounter  Contusion of left lower leg, initial encounter     Discharge Instructions      SPRAIN: Stressed avoiding painful activities . Reviewed RICE guidelines. Use medications as directed, including NSAIDs. If no NSAIDs have been prescribed for you today, you may take Aleve or Motrin over the counter. May use Tylenol in between doses of NSAIDs.  If no improvement in the next 1-2 weeks, f/u with PCP or return to our office for reexamination, and please feel free to call or return at any time for any questions or concerns you may have and we will be happy to help you!      I am not concerned about a blood clot but if you feel that your swelling worsens or you notice increased redness, warmth or pain, please go to emergency department for reevaluation and consideration of imaging of the left lower leg.     ED Prescriptions   None    PDMP not reviewed this encounter.   Shirlee Latch, PA-C 09/09/22 1037

## 2023-03-26 ENCOUNTER — Other Ambulatory Visit: Payer: Self-pay | Admitting: Family Medicine

## 2023-03-26 DIAGNOSIS — Z1231 Encounter for screening mammogram for malignant neoplasm of breast: Secondary | ICD-10-CM

## 2023-05-08 ENCOUNTER — Ambulatory Visit: Payer: Medicaid Other

## 2023-05-18 ENCOUNTER — Other Ambulatory Visit: Payer: Self-pay | Admitting: Family Medicine

## 2023-05-18 ENCOUNTER — Ambulatory Visit
Admission: RE | Admit: 2023-05-18 | Discharge: 2023-05-18 | Disposition: A | Discharge status: Home / Self Care | Payer: MEDICAID | Attending: Family Medicine | Admitting: Family Medicine

## 2023-05-18 ENCOUNTER — Ambulatory Visit
Admission: RE | Admit: 2023-05-18 | Discharge: 2023-05-18 | Disposition: A | Discharge status: Home / Self Care | Payer: MEDICAID | Source: Ambulatory Visit | Attending: Family Medicine | Admitting: Family Medicine

## 2023-05-18 DIAGNOSIS — M79672 Pain in left foot: Secondary | ICD-10-CM | POA: Insufficient documentation

## 2023-07-10 ENCOUNTER — Ambulatory Visit
Admission: RE | Admit: 2023-07-10 | Discharge: 2023-07-10 | Disposition: A | Discharge status: Home / Self Care | Payer: MEDICAID | Source: Ambulatory Visit | Attending: Family Medicine | Admitting: Family Medicine

## 2023-07-10 DIAGNOSIS — Z1231 Encounter for screening mammogram for malignant neoplasm of breast: Secondary | ICD-10-CM | POA: Insufficient documentation

## 2023-07-25 ENCOUNTER — Encounter: Payer: Self-pay | Admitting: Family Medicine

## 2024-02-21 ENCOUNTER — Other Ambulatory Visit: Payer: Self-pay | Admitting: Family Medicine

## 2024-02-21 DIAGNOSIS — N644 Mastodynia: Secondary | ICD-10-CM

## 2024-02-29 ENCOUNTER — Ambulatory Visit
Admission: RE | Admit: 2024-02-29 | Discharge: 2024-02-29 | Disposition: A | Discharge status: Home / Self Care | Payer: MEDICAID | Source: Ambulatory Visit | Attending: Family Medicine | Admitting: Family Medicine

## 2024-02-29 DIAGNOSIS — N644 Mastodynia: Secondary | ICD-10-CM

## 2024-06-26 ENCOUNTER — Other Ambulatory Visit: Payer: Self-pay | Admitting: Family Medicine

## 2024-06-26 DIAGNOSIS — Z1231 Encounter for screening mammogram for malignant neoplasm of breast: Secondary | ICD-10-CM

## 2024-07-13 ENCOUNTER — Ambulatory Visit
Admission: EM | Admit: 2024-07-13 | Discharge: 2024-07-13 | Disposition: A | Discharge status: Home / Self Care | Payer: MEDICAID

## 2024-07-13 ENCOUNTER — Encounter: Payer: Self-pay | Admitting: Emergency Medicine

## 2024-07-13 DIAGNOSIS — R21 Rash and other nonspecific skin eruption: Secondary | ICD-10-CM | POA: Diagnosis not present

## 2024-07-13 MED ORDER — HYDROXYZINE HCL 25 MG PO TABS: 25.0000 mg | ORAL_TABLET | Freq: Four times a day (QID) | ORAL | 0 refills | Status: DC | PRN

## 2024-07-13 MED ORDER — PREDNISONE 10 MG PO TABS: ORAL_TABLET | ORAL | 0 refills | Status: AC

## 2024-07-13 MED ORDER — HYDROXYZINE HCL 25 MG PO TABS: 25.0000 mg | ORAL_TABLET | Freq: Four times a day (QID) | ORAL | 0 refills | Status: AC | PRN

## 2024-07-13 NOTE — ED Triage Notes (Signed)
 Pt c/o rash all over body. She states she slept on her couch that was from a thrift store.she was seen by her PCP and treated for scabies. Pt states the rash is not getting better.

## 2024-07-13 NOTE — ED Provider Notes (Signed)
 [facility]    CSN: [id number] Arrival date & time: 07/13/24  0950      History   Chief Complaint Chief Complaint  Patient presents with   Rash    scabies    HPI Andrea [name] is a 58 y.o. female presenting for 2-week history of pruritic erythematous full-body rash.  Patient seen by PCP twice.  Initially treated on 9/16 with Keflex .  She says this medication did not work so she saw them again on 9/23. Diagnosed with folliculitis and scabies by PCP. Treated with doxycycline  and permethrin 5 days ago.  Patient says none of the medications she has been treated with has improved her rash and she thinks it has actually made things worse.  She denies any associated mouth lesions, throat tightness or swelling or difficulty breathing.  Symptoms began around the time she got a new couch from a thrift store and she thinks it could be related.  She has not taken any corticosteroids or antihistamines.  HPI  Past Medical History:  Diagnosis Date   Diabetes mellitus without complication (HCC)    DVT (deep venous thrombosis) (HCC)    Fibromyalgia    Head ache    Prediabetes    Schizophrenia (HCC)    Thyroid disease     Patient Active Problem List   Diagnosis Date Noted   Hypothyroidism 08/23/2021   Paranoid schizophrenia (HCC) 08/23/2021   Controlled type 2 diabetes mellitus without complication, without long-term current use of insulin (HCC) 08/25/2015   Obesity (BMI 30-39.9) 08/25/2015   Persistent headaches 08/25/2015   Frequency of micturition 04/01/2013   Nocturia 04/01/2013   DVT (deep venous thrombosis) (HCC) 08/21/2012    Past Surgical History:  Procedure Laterality Date   BREAST BIOPSY Left 06/14/2022   Stereo bx, Ribbon Clip, benign   BREAST CYST ASPIRATION Left    long long time ago   SALPINGECTOMY Right     OB History     Gravida  2   Para  1   Term      Preterm      AB      Living  1      SAB      IAB      Ectopic      Multiple       Live Births           Obstetric Comments  Menstrual age: 63  Age 1st Pregnancy: 89           Home Medications    Prior to Admission medications   Medication Sig Start Date End Date Taking? Authorizing Provider  acetaminophen  (TYLENOL ) 500 MG tablet Take 500 mg by mouth every 6 (six) hours as needed.   Yes [provider]  aspirin EC 81 MG tablet Take 81 mg by mouth daily.   Yes [provider]  atorvastatin (LIPITOR) 20 MG tablet Take by mouth. 01/03/21  Yes [provider]  doxycycline  (VIBRAMYCIN ) 100 MG capsule Take 100 mg by mouth 2 (two) times daily. for 7 days 07/08/24 07/15/24 Yes [provider]  gabapentin (NEURONTIN) 100 MG capsule Take by mouth. 07/13/21  Yes [provider]  hydrOXYzine (ATARAX) 25 MG tablet Take 1 tablet (25 mg total) by mouth every 6 (six) hours as needed for itching. 07/13/24  Yes Arvis Jolan NOVAK, PA-C  levothyroxine (SYNTHROID) 125 MCG tablet Take by mouth. 01/03/21  Yes [provider]  metFORMIN (GLUCOPHAGE-XR) 500 MG 24 hr tablet Take  by mouth. 07/13/21  Yes [provider]  mupirocin  ointment (BACTROBAN ) 2 %  03/05/24  Yes [provider]  nystatin cream (MYCOSTATIN) Apply 1 application topically 2 (two) times daily.   Yes [provider]  OLANZapine zydis (ZYPREXA) 10 MG disintegrating tablet Take 10 mg by mouth at bedtime.   Yes [provider]  predniSONE (DELTASONE) 10 MG tablet Take 6 tabs p.o. on day 1 and decrease by 1 tablet daily until complete 07/13/24  Yes Arvis Huxley B, PA-C  Wheat Dextrin (BENEFIBER DRINK MIX PO) Take 1 packet by mouth daily.   Yes [provider]  hydrOXYzine (ATARAX) 25 MG tablet Take 1 tablet (25 mg total) by mouth every 6 (six) hours as needed for itching. 07/13/24   Arvis Huxley B, PA-C  polyethylene glycol (MIRALAX ) packet Take 17 g by mouth 2 (two) times daily. 04/25/16   Jason Leita Blush, MD    Family  History Family History  Problem Relation Age of Onset   Colon cancer Mother    Diabetes Mother    Mental illness Mother    Colon cancer Father    Breast cancer Neg Hx     Social History Social History   Tobacco Use   Smoking status: Former    Current packs/day: 0.00    Types: Cigarettes    Quit date: 08/01/2007    Years since quitting: 16.9   Smokeless tobacco: Never  Vaping Use   Vaping status: Never Used  Substance Use Topics   Alcohol use: No   Drug use: No     Allergies   Bee venom   Review of Systems Review of Systems  Constitutional:  Negative for fatigue and fever.  HENT:  Negative for trouble swallowing.   Respiratory:  Negative for chest tightness and shortness of breath.   Gastrointestinal:  Negative for nausea and vomiting.  Skin:  Positive for rash.  Allergic/Immunologic: Positive for environmental allergies.  Neurological:  Negative for dizziness and headaches.     Physical Exam Triage Vital Signs ED Triage Vitals  Encounter Vitals Group     BP 07/13/24 1007 121/78     Girls Systolic BP Percentile --      Girls Diastolic BP Percentile --      Boys Systolic BP Percentile --      Boys Diastolic BP Percentile --      Pulse Rate 07/13/24 1007 65     Resp 07/13/24 1007 16     Temp 07/13/24 1007 98.7 F (37.1 C)     Temp Source 07/13/24 1007 Oral     SpO2 07/13/24 1007 95 %     Weight 07/13/24 1006 177 lb 0.5 oz (80.3 kg)     Height 07/13/24 1006 5' 7 (1.702 m)     Head Circumference --      Peak Flow --      Pain Score 07/13/24 1005 0     Pain Loc --      Pain Education --      Exclude from Growth Chart --    No data found.  Updated Vital Signs BP 121/78 (BP Location: Left Arm)   Pulse 65   Temp 98.7 F (37.1 C) (Oral)   Resp 16   Ht 5' 7 (1.702 m)   Wt 177 lb 0.5 oz (80.3 kg)   SpO2 95%   BMI 27.73 kg/m   Physical Exam Vitals and nursing note reviewed.  Constitutional:      General:  She is not in acute distress.     Appearance: Normal appearance. She is not ill-appearing or toxic-appearing.  HENT:     Head: Normocephalic and atraumatic.     Nose: Nose normal.     Mouth/Throat:     Mouth: Mucous membranes are moist.     Pharynx: Oropharynx is clear.  Eyes:     General: No scleral icterus.       Right eye: No discharge.        Left eye: No discharge.     Conjunctiva/sclera: Conjunctivae normal.  Cardiovascular:     Rate and Rhythm: Normal rate and regular rhythm.     Heart sounds: Normal heart sounds.  Pulmonary:     Effort: Pulmonary effort is normal. No respiratory distress.     Breath sounds: Normal breath sounds.  Musculoskeletal:     Cervical back: Neck supple.  Skin:    General: Skin is dry.     Findings: Rash present.     Comments: Generalized erythematous maculopapular rash  Neurological:     General: No focal deficit present.     Mental Status: She is alert. Mental status is at baseline.     Motor: No weakness.     Gait: Gait normal.  Psychiatric:        Mood and Affect: Mood normal.        Behavior: Behavior normal.             UC Treatments / Results  Labs (all labs ordered are listed, but only abnormal results are displayed) Labs Reviewed - No data to display  EKG   Radiology No results found.  Procedures Procedures (including critical care time)  Medications Ordered in UC Medications - No data to display  Initial Impression / Assessment and Plan / UC Course  I have reviewed the triage vital signs and the nursing notes.  Pertinent labs & imaging results that were available during my care of the patient were reviewed by me and considered in my medical decision making (see chart for details).   58 year old female presents for 2-week history of generalized erythematous maculopapular rash.  Denies throat swelling or breathing difficulty.  Symptoms started around the time she got a new couch and thinks it could be related to that.  She has been treated with  Keflex , doxycycline  and permethrin by her PCP but symptoms have continued to worsen.  Vitals are stable.  See images included in chart.  Not consistent with folliculitis or suspicion for scabies.  Appears to be allergic type rash.  Will start her on prednisone and also prescribed hydroxyzine.  Advised to use it on fragranced body washes, detergents and topicals.   Final Clinical Impressions(s) / UC Diagnoses   Final diagnoses:  Rash and nonspecific skin eruption     Discharge Instructions      - You do not have infection or mites. - This rash looks like an allergy type rash.  Start antihistamines and corticosteroids.  You may apply unscented body washes and lotions. - Follow-up with PCP.     ED Prescriptions     Medication Sig Dispense Auth. Provider   predniSONE (DELTASONE) 10 MG tablet Take 6 tabs p.o. on day 1 and decrease by 1 tablet daily until complete 21 tablet Arvis Huxley B, PA-C   hydrOXYzine (ATARAX) 25 MG tablet  (Status: Discontinued) Take 1 tablet (25 mg total) by mouth every 6 (six) hours as needed for itching. 30 tablet Arvis Huxley NOVAK, PA-C  hydrOXYzine (ATARAX) 25 MG tablet Take 1 tablet (25 mg total) by mouth every 6 (six) hours as needed for itching. 30 tablet Arvis Huxley B, PA-C   hydrOXYzine (ATARAX) 25 MG tablet Take 1 tablet (25 mg total) by mouth every 6 (six) hours as needed for itching. 30 tablet Damyan Corne B, PA-C      PDMP not reviewed this encounter.   Arvis Huxley NOVAK, PA-C 07/13/24 1105

## 2024-07-13 NOTE — Discharge Instructions (Signed)
-   You do not have infection or mites. - This rash looks like an allergy type rash.  Start antihistamines and corticosteroids.  You may apply unscented body washes and lotions. - Follow-up with PCP.

## 2024-07-29 ENCOUNTER — Ambulatory Visit: Payer: MEDICAID

## 2024-08-14 ENCOUNTER — Ambulatory Visit
Admission: RE | Admit: 2024-08-14 | Discharge: 2024-08-14 | Disposition: A | Discharge status: Home / Self Care | Payer: MEDICAID | Source: Ambulatory Visit | Attending: Family Medicine | Admitting: Family Medicine

## 2024-08-14 DIAGNOSIS — Z1231 Encounter for screening mammogram for malignant neoplasm of breast: Secondary | ICD-10-CM | POA: Diagnosis present
